# Patient Record
Sex: Female | Born: 2000 | Race: White | Hispanic: No | Marital: Single | Smoking: Never smoker
Health system: Southern US, Community
[De-identification: ages and names within clinical notes are randomized; demographics above are authoritative.]

## PROBLEM LIST (undated history)

## (undated) ENCOUNTER — Inpatient Hospital Stay (HOSPITAL_COMMUNITY): Admission: RE | Payer: Medicaid Other | Source: Ambulatory Visit

## (undated) DIAGNOSIS — F32A Depression, unspecified: Secondary | ICD-10-CM

## (undated) DIAGNOSIS — F419 Anxiety disorder, unspecified: Secondary | ICD-10-CM

## (undated) DIAGNOSIS — R519 Headache, unspecified: Secondary | ICD-10-CM

## (undated) HISTORY — PX: NO PAST SURGERIES: SHX2092

## (undated) HISTORY — PX: CHOLECYSTECTOMY: SHX55

---

## 2018-12-08 DIAGNOSIS — Z8759 Personal history of other complications of pregnancy, childbirth and the puerperium: Secondary | ICD-10-CM

## 2020-03-25 NOTE — L&D Delivery Note (Addendum)
OB/GYN Faculty Practice Delivery Note  Katherine Love is a 20 y.o. G2P0101 s/p SVD at [redacted]w[redacted]d. She was admitted for IOL for new onset gHTN with limited prenatal care.  ROM: 2h 70m with clear fluid GBS Status: Negative Maximum Maternal Temperature: 98.3 F  Delivery Date/Time: 02/07/2021  Delivery: Called to room and patient was complete and pushing. Head delivered, LOA. No nuchal cord present. Shoulder dystocia noted, maneuvers applied (see below). Dr. Ashok Pall paged when suprapubic pressure applied. Maneuvers utilized with delivery of posterior arm, followed by anterior shoulder and body. Infant with spontaneous cry, placed on mother's abdomen, dried and stimulated. Cord clamped without delay, and cut by me. Cord blood drawn. Placenta delivered spontaneously with gentle cord traction. Fundus firm with massage and Pitocin. Labia, perineum, vagina, and cervix inspected inspected with no lacerations.   Delivery of the head: 02/07/2021  1:22 PM First maneuver: 02/07/2021  1:22 PM, McRoberts Second maneuver: 02/07/2021  1:23 PM, Suprapubic Pressure Third maneuver: 02/07/2021 , 1:24 PM, Delivery of posterior arm by Collene Gobble, CNM  Placenta: Intact, 3VC Complications: Shoulder dystocia Lacerations: None EBL: 200 cc Analgesia: Epidural  Infant: Viable  APGARs 7 & 9  3420g  Darral Dash, DO [facility] PGY-1 [facility], [facility] 02/07/2021, 2:47 PM  Katherine Love is a 20 y.o. female [id number] with IUP at [redacted]w[redacted]d admitted for IOL for GHTN with limited prenatal care. Pt had abnormal 1 hour GTT in early pregnancy and no additional testing for GDM.  She progressed with augmentation to complete and pushed less than 20 minutes to deliver.  Shoulder dystocia was identified at delivery of fetal head and 3 maneuvers were required, McRoberts, suprapubic pressure by RN, and delivery of posterior arm by me.  Attempt to page Dr Ashok Pall at suprapubic pressure  was unsuccessful and emergency cord for bed alarm was pulled by RN.  Delivery of posterior arm resulted in rapid delivery of rest of infant who was stimulated on maternal abdomen but taken to warmer for further resuscitation/evaluation by RN after cord clamping.  Infant transitioned well with 7/9 APGARs and remained in room with patient after delivery.   Mom and baby stable prior to transfer to postpartum. She plans on breastfeeding.   Sharen Counter

## 2020-07-01 ENCOUNTER — Inpatient Hospital Stay (HOSPITAL_COMMUNITY)
Admission: AD | Admit: 2020-07-01 | Discharge: 2020-07-02 | Disposition: A | Discharge status: Home / Self Care | Payer: Medicaid Other | Attending: Obstetrics & Gynecology | Admitting: Obstetrics & Gynecology

## 2020-07-01 ENCOUNTER — Other Ambulatory Visit: Payer: Self-pay

## 2020-07-01 ENCOUNTER — Encounter (HOSPITAL_COMMUNITY): Payer: Self-pay

## 2020-07-01 ENCOUNTER — Inpatient Hospital Stay (HOSPITAL_COMMUNITY): Payer: Medicaid Other

## 2020-07-01 DIAGNOSIS — O219 Vomiting of pregnancy, unspecified: Secondary | ICD-10-CM | POA: Insufficient documentation

## 2020-07-01 DIAGNOSIS — Z3A01 Less than 8 weeks gestation of pregnancy: Secondary | ICD-10-CM | POA: Diagnosis not present

## 2020-07-01 DIAGNOSIS — O26891 Other specified pregnancy related conditions, first trimester: Secondary | ICD-10-CM | POA: Insufficient documentation

## 2020-07-01 DIAGNOSIS — R109 Unspecified abdominal pain: Secondary | ICD-10-CM | POA: Insufficient documentation

## 2020-07-01 HISTORY — DX: Anxiety disorder, unspecified: F41.9

## 2020-07-01 HISTORY — DX: Headache, unspecified: R51.9

## 2020-07-01 HISTORY — DX: Depression, unspecified: F32.A

## 2020-07-01 LAB — URINALYSIS, ROUTINE W REFLEX MICROSCOPIC
Bilirubin Urine: NEGATIVE
Glucose, UA: NEGATIVE mg/dL
Hgb urine dipstick: NEGATIVE
Ketones, ur: NEGATIVE mg/dL
Nitrite: NEGATIVE
Protein, ur: NEGATIVE mg/dL
Specific Gravity, Urine: 1.021 (ref 1.005–1.030)
pH: 6 (ref 5.0–8.0)

## 2020-07-01 LAB — CBC
HCT: 41.1 % (ref 36.0–46.0)
Hemoglobin: 14.3 g/dL (ref 12.0–15.0)
MCH: 30.8 pg (ref 26.0–34.0)
MCHC: 34.8 g/dL (ref 30.0–36.0)
MCV: 88.6 fL (ref 80.0–100.0)
Platelets: 386 10*3/uL (ref 150–400)
RBC: 4.64 MIL/uL (ref 3.87–5.11)
RDW: 12.7 % (ref 11.5–15.5)
WBC: 12.5 10*3/uL — ABNORMAL HIGH (ref 4.0–10.5)
nRBC: 0 % (ref 0.0–0.2)

## 2020-07-01 LAB — COMPREHENSIVE METABOLIC PANEL
ALT: 24 U/L (ref 0–44)
AST: 20 U/L (ref 15–41)
Albumin: 4 g/dL (ref 3.5–5.0)
Alkaline Phosphatase: 79 U/L (ref 38–126)
Anion gap: 12 (ref 5–15)
BUN: 9 mg/dL (ref 6–20)
CO2: 22 mmol/L (ref 22–32)
Calcium: 9.7 mg/dL (ref 8.9–10.3)
Chloride: 100 mmol/L (ref 98–111)
Creatinine, Ser: 0.58 mg/dL (ref 0.44–1.00)
GFR, Estimated: >60 mL/min (ref >60)
Glucose, Bld: 95 mg/dL (ref 70–99)
Potassium: 3.7 mmol/L (ref 3.5–5.1)
Sodium: 134 mmol/L — ABNORMAL LOW (ref 135–145)
Total Bilirubin: 0.7 mg/dL (ref 0.3–1.2)
Total Protein: 7.2 g/dL (ref 6.5–8.1)

## 2020-07-01 LAB — I-STAT BETA HCG BLOOD, ED (MC, WL, AP ONLY): I-stat hCG, quantitative: >2000 m[IU]/mL — ABNORMAL HIGH (ref <5)

## 2020-07-01 LAB — LIPASE, BLOOD: Lipase: 32 U/L (ref 11–51)

## 2020-07-01 MED ORDER — PROMETHAZINE HCL 12.5 MG PO TABS: 12.5000 mg | ORAL_TABLET | Freq: Four times a day (QID) | ORAL | 0 refills | Status: DC | PRN

## 2020-07-01 MED ORDER — SODIUM CHLORIDE 0.9 % IV SOLN
25.0000 mg | Freq: Once | INTRAVENOUS | Status: AC
  Administered 2020-07-01: 25 mg via INTRAVENOUS
  Filled 2020-07-01: qty 1

## 2020-07-01 MED ORDER — DOXYLAMINE SUCCINATE (SLEEP) 25 MG PO TABS: 25.0000 mg | ORAL_TABLET | Freq: Three times a day (TID) | ORAL | 0 refills | Status: DC | PRN

## 2020-07-01 MED ORDER — LACTATED RINGERS IV BOLUS
1000.0000 mL | Freq: Once | INTRAVENOUS | Status: AC
  Administered 2020-07-01: 1000 mL via INTRAVENOUS

## 2020-07-01 MED ORDER — PYRIDOXINE HCL 25 MG PO TABS: 25.0000 mg | ORAL_TABLET | Freq: Three times a day (TID) | ORAL | 0 refills | Status: DC

## 2020-07-01 NOTE — MAU Note (Signed)
Liter of IV phenergan finishing-no additional emesis-pt states that her nausea has resolved.

## 2020-07-01 NOTE — ED Triage Notes (Signed)
Patient coming from home with emesis, reports she is unable to keep anything down x several days, reports she has not had an ultrasound to confirm pregnancy but has had multiple positive tests at home, hx of morning sickness.

## 2020-07-01 NOTE — Discharge Instructions (Signed)
Vomiting in First Trimester   What are the causes? The cause of this condition is not known. What increases the risk?  You had vomiting or a feeling like you may vomit before your pregnancy.  You had morning sickness in another pregnancy.  You are pregnant with more than one baby, such as twins. What are the signs or symptoms?  Feeling like you may vomit.  Vomiting. How is this treated? Treatment is usually not needed for this condition. You may only need to change what you eat. In some cases, your doctor may give you some things to take for your condition. These include:  Vitamin B6 supplements.  Medicines to treat the feeling that you may vomit.  Ginger. Follow these instructions at home: Medicines  Take over-the-counter and prescription medicines only as told by your doctor. Do not take any medicines until you talk with your doctor about them first.  Take multivitamins before you get pregnant. These can stop or lessen the symptoms of morning sickness. Eating and drinking  Eat dry toast or crackers before getting out of bed.  Eat 5 or 6 small meals a day.  Eat dry and bland foods like rice and baked potatoes.  Do not eat greasy, fatty, or spicy foods.  Have someone cook for you if the smell of food causes you to vomit or to feel like you may vomit.  If you feel like you may vomit after taking prenatal vitamins, take them at night or with a snack.  Eat protein foods when you need a snack. Nuts, yogurt, and cheese are good choices.  Drink fluids throughout the day.  Try ginger ale made with real ginger, ginger tea made from fresh grated ginger, or ginger candies. General instructions  Do not smoke or use any products that contain nicotine or tobacco. If you need help quitting, ask your doctor.  Use an air purifier to keep the air in your house free of smells.  Get lots of fresh air.  Try to avoid smells that make you feel sick.  Try wearing an acupressure  wristband. This is a wristband that is used to treat seasickness.  Try a treatment called acupuncture. In this treatment, a doctor puts needles into certain areas of your body to make you feel better. Contact a doctor if:  You need medicine to feel better.  You feel dizzy or light-headed.  You are losing weight. Get help right away if:  The feeling that you may vomit will not go away, or you cannot stop vomiting.  You faint.  You have very bad pain in your belly. Summary  Morning sickness is when you feel like you may vomit (feel nauseous) during pregnancy.  You may feel sick in the morning, but you can feel this way at any time of the day.  Making some changes to what you eat may help your symptoms go away. This information is not intended to replace advice given to you by your health care provider. Make sure you discuss any questions you have with your health care provider. Document Revised: 10/25/2019 Document Reviewed: 10/04/2019 Elsevier Patient Education  2021 [employer].

## 2020-07-01 NOTE — ED Provider Notes (Addendum)
Emergency Medicine Provider OB Triage Evaluation Note  Katherine Love is a 20 y.o. female, G2P1, at Unknown gestation who presents to the emergency department with complaints of intractable N/V. 5 Positive home preg tests.  LMP was sometime in January to February, reports irregular periods.  Denies abdominal pain.  Review of  Systems  Positive: nausea, vomiting Negative: abd pain  Physical Exam  BP 128/83 (BP Location: Left Arm)   Pulse 87   Temp 98.3 F (36.8 C) (Oral)   Resp 20   Ht 5\' 5"  (1.651 m)   Wt 99.8 kg   LMP  (LMP Unknown)   SpO2 100%   BMI 36.61 kg/m  General: Alert HEENT: Atraumatic  Resp: Normal effort  Cardiac: Normal rate Abd: Nondistended MSK: Moves all extremities without difficulty Neuro: Speech clear  Medical Decision Making  I-STAT quant HCG is positive. Pt evaluated for pregnancy concern and is stable for transfer to MAU. Pt is in agreement with plan for transfer.  8:17 PM Discussed with MAU provider, Sam, who accepts patient in transfer.  Clinical Impression  No diagnosis found.     Wanetta Funderburke, N, PA-C 07/01/20 2018    Alek Borges, 08/31/20 N, PA-C 07/01/20 2019    08/31/20, MD 07/03/20 1125

## 2020-07-01 NOTE — ED Notes (Signed)
Report called to Adelina Mings, RN MAU.

## 2020-07-01 NOTE — MAU Provider Note (Signed)
History     CSN: [id number]  Arrival date and time: 07/01/20 1934   Event Date/Time   First Provider Initiated Contact with Patient 07/01/20 2140      Chief Complaint  Patient presents with  . Emesis During Pregnancy   HPI Katherine Love is a 20 y.o. G2P0101 at [redacted]w[redacted]d who presents to MAU from [facility] for evaluation of recurrent vomiting in the setting of positive home pregnancy test. She endorses multiple episodes of vomiting each day since mid March. She does not have access to antiemetics. She endorses mild recurrent suprapubic cramping. She denies vaginal bleeding, leaking of fluid, decreased fetal movement, fever, falls, or recent illness.   OB History    Gravida  2   Para  1   Term  0   Preterm  1   AB  0   Living  1     SAB  0   IAB  0   Ectopic  0   Multiple  0   Live Births  1           Past Medical History:  Diagnosis Date  . Anxiety   . Depression   . Headache     Past Surgical History:  Procedure Laterality Date  . NO PAST SURGERIES      Family History  Problem Relation Age of Onset  . Hypertension Mother     Social History   Tobacco Use  . Smoking status: Never Smoker  . Smokeless tobacco: Never Used  Vaping Use  . Vaping Use: Never used  Substance Use Topics  . Alcohol use: Never  . Drug use: Yes    Frequency: 2.0 times per week    Types: Marijuana    Allergies: No Known Allergies  No medications prior to admission.    Review of Systems  Gastrointestinal: Positive for abdominal pain, nausea and vomiting.  All other systems reviewed and are negative.  Physical Exam   Blood pressure 122/70, pulse 77, temperature 98.3 F (36.8 C), temperature source Oral, resp. rate 17, height 5\' 5"  (1.651 m), weight 106.7 kg, SpO2 100 %.  Physical Exam Vitals and nursing note reviewed. Exam conducted with a chaperone present.  Constitutional:      Appearance: Normal appearance. She is obese. She is ill-appearing.  Cardiovascular:      Pulses: Normal pulses.     Heart sounds: Normal heart sounds.  Pulmonary:     Effort: Pulmonary effort is normal.     Breath sounds: Normal breath sounds.  Abdominal:     General: Abdomen is flat. Bowel sounds are normal.  Skin:    Capillary Refill: Capillary refill takes less than 2 seconds.  Neurological:     Mental Status: She is alert and oriented to person, place, and time.  Psychiatric:        Mood and Affect: Mood normal.        Behavior: Behavior normal.        Thought Content: Thought content normal.        Judgment: Judgment normal.     MAU Course  Procedures  Orders Placed This Encounter  Procedures  . OB Comp Less 14 Wks  . Lipase, blood  . Comprehensive metabolic panel  . CBC  . Urinalysis, Routine w reflex microscopic  . Rapid urine drug screen (hospital performed)  . Diet NPO time specified  . I-Stat beta hCG blood, ED  . POC urine preg, ED  . Insert peripheral IV  .  Discharge patient   Patient Vitals for the past 24 hrs:  BP Temp Temp src Pulse Resp SpO2 Height Weight  07/02/20 0005 126/77 98.6 F (37 C) Oral 77 17 100 % -- --  07/01/20 2136 122/70 -- -- 77 17 100 % -- --  07/01/20 2131 -- -- -- -- -- 100 % -- --  07/01/20 2126 -- -- -- -- -- 100 % -- --  07/01/20 2122 132/73 98.3 F (36.8 C) Oral 82 17 100 % 5\' 5"  (1.651 m) 106.7 kg  07/01/20 1942 -- -- -- -- -- -- 5\' 5"  (1.651 m) 99.8 kg  07/01/20 1941 128/83 98.3 F (36.8 C) Oral 87 20 100 % -- --   Results for orders placed or performed during the hospital encounter of 07/01/20 (from the past 24 hour(s))  Urinalysis, Routine w reflex microscopic Urine, Clean Catch     Status: Abnormal   Collection Time: 07/01/20  7:47 PM  Result Value Ref Range   Color, Urine YELLOW YELLOW   APPearance HAZY (A) CLEAR   Specific Gravity, Urine 1.021 1.005 - 1.030   pH 6.0 5.0 - 8.0   Glucose, UA NEGATIVE NEGATIVE mg/dL   Hgb urine dipstick NEGATIVE NEGATIVE   Bilirubin Urine NEGATIVE NEGATIVE    Ketones, ur NEGATIVE NEGATIVE mg/dL   Protein, ur NEGATIVE NEGATIVE mg/dL   Nitrite NEGATIVE NEGATIVE   Leukocytes,Ua MODERATE (A) NEGATIVE   RBC / HPF 0-5 0 - 5 RBC/hpf   WBC, UA 0-5 0 - 5 WBC/hpf   Bacteria, UA RARE (A) NONE SEEN   Squamous Epithelial / LPF 6-10 0 - 5   Mucus PRESENT   Lipase, blood     Status: None   Collection Time: 07/01/20  7:49 PM  Result Value Ref Range   Lipase 32 11 - 51 U/L  Comprehensive metabolic panel     Status: Abnormal   Collection Time: 07/01/20  7:49 PM  Result Value Ref Range   Sodium 134 (L) 135 - 145 mmol/L   Potassium 3.7 3.5 - 5.1 mmol/L   Chloride 100 98 - 111 mmol/L   CO2 22 22 - 32 mmol/L   Glucose, Bld 95 70 - 99 mg/dL   BUN 9 6 - 20 mg/dL   Creatinine, Ser 08/31/20 0.44 - 1.00 mg/dL   Calcium 9.7 8.9 - 08/31/20 mg/dL   Total Protein 7.2 6.5 - 8.1 g/dL   Albumin 4.0 3.5 - 5.0 g/dL   AST 20 15 - 41 U/L   ALT 24 0 - 44 U/L   Alkaline Phosphatase 79 38 - 126 U/L   Total Bilirubin 0.7 0.3 - 1.2 mg/dL   GFR, Estimated 3.84 66.5 mL/min   Anion gap 12 5 - 15  CBC     Status: Abnormal   Collection Time: 07/01/20  7:49 PM  Result Value Ref Range   WBC 12.5 (H) 4.0 - 10.5 K/uL   RBC 4.64 3.87 - 5.11 MIL/uL   Hemoglobin 14.3 12.0 - 15.0 g/dL   HCT >35 08/31/20 - 70.1 %   MCV 88.6 80.0 - 100.0 fL   MCH 30.8 26.0 - 34.0 pg   MCHC 34.8 30.0 - 36.0 g/dL   RDW 77.9 39.0 - 30.0 %   Platelets 386 150 - 400 K/uL   nRBC 0.0 0.0 - 0.2 %  I-Stat beta hCG blood, ED     Status: Abnormal   Collection Time: 07/01/20  8:02 PM  Result Value Ref Range   I-stat  hCG, quantitative >2,000.0 (H) <5 mIU/mL   Comment 3           US OB Comp Less 14 Wks  Result Date: 07/01/2020 CLINICAL DATA:  Abdominal pain EXAM: OBSTETRIC <14 WK ULTRASOUND TECHNIQUE: Transabdominal ultrasound was performed for evaluation of the gestation as well as the maternal uterus and adnexal regions. COMPARISON:  None. FINDINGS: Intrauterine gestational sac: Single intrauterine pregnancy Yolk sac:   Visualized Embryo:  Visualized Cardiac Activity: Visualized Heart Rate: 157 bpm CRL: 12.3 mm   7 w 3 d                  [place] EDC: 02/14/2021 Subchorionic hemorrhage:  None visualized. Maternal uterus/adnexae: Ovaries are within normal limits. The right ovary measures 4.6 x 1.9 x 1.9 cm. Left ovary measures 2.5 x 1.3 x 1.7 cm. Probable corpus luteum in the right ovary. No significant free fluid. IMPRESSION: Single viable intrauterine pregnancy as above. Otherwise no specific abnormality is seen. Electronically Signed   By: Jasmine Pang M.D.   On: 07/01/2020 22:51   Meds ordered this encounter  Medications  . lactated ringers bolus 1,000 mL  . promethazine (PHENERGAN) 25 mg in sodium chloride 0.9 % 1,000 mL infusion  . pyridOXINE (VITAMIN B-6) 25 MG tablet    Sig: Take 1 tablet (25 mg total) by mouth every 8 (eight) hours.    Dispense:  30 tablet    Refill:  0    Order Specific Question:   Supervising Provider    Answer:   Despina Hidden, LUTHER H [[id number]]  . doxylamine, Sleep, (UNISOM) 25 MG tablet    Sig: Take 1 tablet (25 mg total) by mouth every 8 (eight) hours as needed.    Dispense:  30 tablet    Refill:  0    Order Specific Question:   Supervising Provider    Answer:   Despina Hidden, LUTHER H [[id number]]  . promethazine (PHENERGAN) 12.5 MG tablet    Sig: Take 1 tablet (12.5 mg total) by mouth every 6 (six) hours as needed for nausea or vomiting. For vomiting not controlled with B6 and Unisom    Dispense:  30 tablet    Refill:  0    Order Specific Question:   Supervising Provider    Answer:   Lazaro Arms [[id number]]   Assessment and Plan  --20 y.o. G2P0101 with live IUP at [redacted]w[redacted]d  --No ketonuria --New outpatient regimen for management of vomiting --Discharge home in stable condition  Calvert Cantor, CNM 07/02/2020, 1:57 AM

## 2020-07-01 NOTE — MAU Note (Signed)
Pt transferred from ED with complaints of nausea and vomiting. Pt reports nausea for several weeks -and reports she performed a home pregnancy test and had a positive result on March 10. Pt reports irregular periods and gives an LMP in January or February. Pt states vomiting began on Monday. She has tried several different foods and is only able to tolerate sips of water without vomiting. Reports emesis is yellow or consumed food. Denies diarrhea, burning or pain in throat or abdomen. Also denies vaginal bleeding or discharge.

## 2020-07-02 DIAGNOSIS — Z3A01 Less than 8 weeks gestation of pregnancy: Secondary | ICD-10-CM | POA: Diagnosis not present

## 2020-07-02 DIAGNOSIS — O219 Vomiting of pregnancy, unspecified: Secondary | ICD-10-CM

## 2020-07-04 NOTE — Progress Notes (Signed)
    SUBJECTIVE:   CHIEF COMPLAINT / HPI:   Ms. Katherine Love is a 20 yo who presents to establish care.   [redacted]w[redacted]d pregnant. Presented to MAU on 4/10 with recurrent vomiting in the setting of a positive home pregnancy test. US performed in MAU confirming pregnancy. HCG >2,000. She was prescribed Unisom, phenergan, pyridoxine.  Today she states she feels better since starting the medication regimen above from the ED. She endorses right knee pain- hurts to squat, going up stairs. Denies injury to knee. Hurt after son was born, and then again about 5 months ago. Painful after extensive walking or standing on it. Has tried ice without much relief. Has tried a brace for support without relief.   PERTINENT  PMH / PSH:  Anxiety, depression   OBJECTIVE:   BP 122/78   Pulse (!) 105   Wt 234 lb (106.1 kg)   LMP  (LMP Unknown)   SpO2 98%   BMI 38.94 kg/m    Physical exam  General: well appearing, NAD Cardiovascular: RRR, no murmurs Lungs: CTAB. Normal WOB Abdomen: soft, non-distended, non-tender Skin: warm, dry. No edema MSK: normal strength and ROM of extremities bilaterally. R knee without erythema or edema. Non tender to palpation. Normal anterior and posterior drawer tests, normal valgus varus force.   ASSESSMENT/PLAN:   No problem-specific Assessment & Plan notes found for this encounter.  R knee pain   R knee pain when squatting, climbing up or down stairs, or after a lot of walking. 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 Without tenderness to palpation or inciting injury unlikely to be fracture. Gave patient information on condition and that likely as her pregnancy progresses it will be harder on her knees.  - quad exercises - alternate between ice and heat  - Tylenol 500mg  q 4-6h as needed for pain   Pregnancy  G2P0101. [redacted]w[redacted]d. Confirmed via [redacted]w[redacted]d in MAU.   F/u in 2-3 weeks for initial OB visit and to take care of more health maintenance  items     [place], DO [facility]

## 2020-07-05 ENCOUNTER — Ambulatory Visit: Payer: Medicaid Other | Admitting: Family Medicine

## 2020-07-05 ENCOUNTER — Encounter: Payer: Self-pay | Admitting: Family Medicine

## 2020-07-05 ENCOUNTER — Other Ambulatory Visit: Payer: Self-pay

## 2020-07-05 VITALS — BP 122/78 | HR 105 | Wt 234.0 lb

## 2020-07-05 DIAGNOSIS — G8929 Other chronic pain: Secondary | ICD-10-CM | POA: Diagnosis not present

## 2020-07-05 DIAGNOSIS — M25561 Pain in right knee: Secondary | ICD-10-CM

## 2020-07-05 NOTE — Patient Instructions (Signed)
It was great seeing you today!  Today was your first visit to establish care at the clinic. We discussed your knee pain which is likely due to patellofemoral syndrome (information below). I recommend doing some quad exercises, and alternating between ice and heat for relief. You can also take Tylenol 500mg  every 4-6 hours as needed for pain   Please check-out at the front desk before leaving the clinic. I'd like to see you back in the next 2-3 weeks for your initial OB visit, but if you need to be seen earlier than that for any new issues we're happy to fit you in, just give a call!  Feel free to call with any questions or concerns at any time, at [phone].   Take care,  Dr. [phone] [facility]   Patellofemoral Pain Syndrome  Patellofemoral pain syndrome is a condition in which the tissue (cartilage) on the underside of the kneecap (patella) softens or breaks down. This causes pain in the front of the knee. The condition is also called runner's knee or chondromalacia patella. Patellofemoral pain syndrome is most common in young adults who are active in sports. The knee is the largest joint in the body. The patella covers the front of the knee and is attached to muscles above and below the knee. The underside of the patella is covered with a smooth type of cartilage (synovium). The smooth surface helps the patella glide easily when you move your knee. Patellofemoral pain syndrome causes swelling in the joint linings and bone surfaces in the knee. What are the causes? This condition may be caused by:  Overuse of the knee.  Poor alignment of your knee joints.  Weak leg muscles.  A direct hit to your kneecap. What increases the risk? You are more likely to develop this condition if:  You do a lot of activities that can wear down your kneecap. These include: ? Running. ? Squatting. ? Climbing stairs.  You start a new physical activity or exercise  program.  You wear shoes that do not fit well.  You do not have good leg strength.  You are overweight. What are the signs or symptoms? The main symptom of this condition is knee pain. This may feel like a dull, aching pain underneath your patella, in the front of your knee. There may be a popping or cracking sound when you move your knee. Pain may get worse with:  Exercise.  Climbing stairs.  Running.  Jumping.  Squatting.  Kneeling.  Sitting for a long time.  Moving or pushing on your patella. How is this diagnosed? This condition may be diagnosed based on:  Your symptoms and medical history. You may be asked about your recent physical activities and which ones cause knee pain.  A physical exam. This may include: ? Moving your patella back and forth. ? Checking your range of knee motion. ? Having you squat or jump to see if you have pain. ? Checking the strength of your leg muscles.  Imaging tests to confirm the diagnosis. These may include an MRI of your knee. How is this treated? This condition may be treated at home with rest, ice, compression, and elevation (RICE).  Other treatments may include:  NSAIDs, such as ibuprofen.  Physical therapy to stretch and strengthen your leg muscles.  Shoe inserts (orthotics) to take stress off your knee.  A knee brace or knee support.  Adhesive tapes to the skin.  Surgery to remove  damaged cartilage or move the patella to a better position. This is rare. Follow these instructions at home: If you have a brace:  Wear the brace as told by your health care provider. Remove it only as told by your health care provider.  Loosen the brace if your toes tingle, become numb, or turn cold and blue.  Keep the brace clean.  If the brace is not waterproof: ? Do not let it get wet. ? Cover it with a watertight covering when you take a bath or a shower. Managing pain, stiffness, and swelling  If directed, put ice on the  painful area. To do this: ? If you have a removable brace, remove it as told by your health care provider. ? Put ice in a plastic bag. ? Place a towel between your skin and the bag. ? Leave the ice on for 20 minutes, 2-3 times a day. ? Remove the ice if your skin turns bright red. This is very important. If you cannot feel pain, heat, or cold, you have a greater risk of damage to the area.  Move your toes often to reduce stiffness and swelling.  Raise (elevate) the injured area above the level of your heart while you are sitting or lying down.   Activity  Rest your knee.  Avoid activities that cause knee pain.  Perform stretching and strengthening exercises as told by your health care provider or physical therapist.  Return to your normal activities as told by your health care provider. Ask your health care provider what activities are safe for you. General instructions  Take over-the-counter and prescription medicines only as told by your health care provider.  Use splints, braces, knee supports, or walking aids as directed by your health care provider.  Do not use any products that contain nicotine or tobacco, such as cigarettes, e-cigarettes, and chewing tobacco. These can delay healing. If you need help quitting, ask your health care provider.  Keep all follow-up visits. This is important. Contact a health care provider if:  Your symptoms get worse.  You are not improving with home care. Summary  Patellofemoral pain syndrome is a condition in which the tissue (cartilage) on the underside of the kneecap (patella) softens or breaks down.  This condition causes swelling in the joint linings and bone surfaces in the knee. This leads to pain in the front of the knee.  This condition may be treated at home with rest, ice, compression, and elevation (RICE).  Use splints, braces, knee supports, or walking aids as directed by your health care provider. This information is not  intended to replace advice given to you by your health care provider. Make sure you discuss any questions you have with your health care provider. Document Revised: 08/25/2019 Document Reviewed: 08/25/2019 Elsevier Patient Education  2021 [employer].

## 2020-07-28 ENCOUNTER — Ambulatory Visit: Payer: Medicaid Other | Admitting: Family Medicine

## 2020-08-02 ENCOUNTER — Encounter: Payer: Medicaid Other | Admitting: Family Medicine

## 2020-08-02 ENCOUNTER — Telehealth: Payer: Self-pay | Admitting: Family Medicine

## 2020-08-02 NOTE — Telephone Encounter (Signed)
Will forward to [employer] [facility] [employer] who handles these New OB appts.  Jared Whorley,CMA

## 2020-08-02 NOTE — Patient Instructions (Incomplete)
Please go to the MAU (maternal assessment unit) at [facility] if you have any extreme cramping, loss of fluid, vaginal bleeding.      Common Medications Safe in Pregnancy  Acne:      Constipation:  Benzoyl Peroxide     Colace  Clindamycin      Dulcolax Suppository  Topica Erythromycin     Fibercon  Salicylic Acid      Metamucil         Miralax AVOID:        Senakot   Accutane    Cough:  Retin-A       Cough Drops  Tetracycline      Phenergan w/ Codeine if Rx  Minocycline      Robitussin (Plain & DM)  Antibiotics:     Crabs/Lice:  Ceclor       RID  Cephalosporins    AVOID:  E-Mycins      Kwell  Keflex  Macrobid/Macrodantin   Diarrhea:  Penicillin      Kao-Pectate  Zithromax      Imodium AD         PUSH FLUIDS AVOID:       Cipro     Fever:  Tetracycline      Tylenol (Regular or Extra  Minocycline       Strength)  Levaquin      Extra Strength-Do not          Exceed 8 tabs/24 hrs Caffeine:        <273m/day (equiv. To 1 cup of coffee or  approx. 3 12 oz sodas)         Gas: Cold/Hayfever:       Gas-X  Benadryl      Mylicon  Claritin       Phazyme  **Claritin-D        Chlor-Trimeton    Headaches:  Dimetapp      ASA-Free Excedrin  Drixoral-Non-Drowsy     Cold Compress  Mucinex (Guaifenasin)     Tylenol (Regular or Extra  Sudafed/Sudafed-12 Hour     Strength)  **Sudafed PE Pseudoephedrine   Tylenol Cold & Sinus     Vicks Vapor Rub  Zyrtec  **AVOID if Problems With Blood Pressure         Heartburn: Avoid lying down for at least 1 hour after meals  Aciphex      Maalox     Rash:  Milk of Magnesia     Benadryl    Mylanta       1% Hydrocortisone Cream  Pepcid  Pepcid Complete   Sleep Aids:  Prevacid      Ambien   Prilosec       Benadryl  Rolaids       Chamomile Tea  Tums (Limit 4/day)     Unisom         Tylenol PM         Warm milk-add vanilla or  Hemorrhoids:       Sugar for taste  Anusol/Anusol H.C.  (RX: Analapram 2.5%)  Sugar  Substitutes:  Hydrocortisone OTC     Ok in moderation  Preparation H      Tucks        Vaseline lotion applied to tissue with wiping    Herpes:     Throat:  Acyclovir      Oragel  Famvir  Valtrex     Vaccines:         Flu Shot Leg  Cramps:       *Gardasil  Benadryl      Hepatitis A         Hepatitis B Nasal Spray:       Pneumovax  Saline Nasal Spray     Polio Booster         Tetanus Nausea:       Tuberculosis test or PPD  Vitamin B6 25 mg TID   AVOID:    Dramamine      *Gardasil  Emetrol       Live Poliovirus  Ginger Root 250 mg QID    MMR (measles, mumps &  High Complex Carbs @ Bedtime    rebella)  Sea Bands-Accupressure    Varicella (Chickenpox)  Unisom 1/2 tab TID     *No known complications           If received before Pain:         Known pregnancy;   Darvocet       Resume series after  Lortab        Delivery  Percocet    Yeast:   Tramadol      Femstat  Tylenol 3      Gyne-lotrimin  Ultram       Monistat  Vicodin           MISC:         All Sunscreens           Hair Coloring/highlights          Insect Repellant's          (Including DEET)         Mystic Tans       Commonly Asked Questions During Pregnancy  Cats: A parasite can be excreted in cat feces.  To avoid exposure you need to have another person empty the little box.  If you must empty the litter box you will need to wear gloves.  Wash your hands after handling your cat.  This parasite can also be found in raw or undercooked meat so this should also be avoided.  Colds, Sore Throats, Flu: Please check your medication sheet to see what you can take for symptoms.  If your symptoms are unrelieved by these medications please call the office.  Dental Work: Most any dental work Investment banker, corporate recommends is permitted.  X-rays should only be taken during the first trimester if absolutely necessary.  Your abdomen should be shielded with a lead apron during all x-rays.  Please notify your provider prior to receiving  any x-rays.  Novocaine is fine; gas is not recommended.  If your dentist requires a note from [place] prior to dental work please call the office and we will provide one for you.  Exercise: Exercise is an important part of staying healthy during your pregnancy.  You may continue most exercises you were accustomed to prior to pregnancy.  Later in your pregnancy you will most likely notice you have difficulty with activities requiring balance like riding a bicycle.  It is important that you listen to your body and avoid activities that put you at a higher risk of falling.  Adequate rest and staying well hydrated are a must!  If you have questions about the safety of specific activities ask your provider.    Exposure to Children with illness: Try to avoid obvious exposure; report any symptoms to [place] when noted,  If you have chicken pos, red measles or mumps, you should be immune to these  diseases.   Please do not take any vaccines while pregnant unless you have checked with your OB provider.  Fetal Movement: After 28 weeks we recommend you do "kick counts" twice daily.  Lie or sit down in a calm quiet environment and count your baby movements "kicks".  You should feel your baby at least 10 times per hour.  If you have not felt 10 kicks within the first hour get up, walk around and have something sweet to eat or drink then repeat for an additional hour.  If count remains less than 10 per hour notify your provider.  Fumigating: Follow your pest control agent's advice as to how long to stay out of your home.  Ventilate the area well before re-entering.  Hemorrhoids:   Most over-the-counter preparations can be used during pregnancy.  Check your medication to see what is safe to use.  It is important to use a stool softener or fiber in your diet and to drink lots of liquids.  If hemorrhoids seem to be getting worse please call the office.   Hot Tubs:  Hot tubs Jacuzzis and saunas are not recommended while pregnant.   These increase your internal body temperature and should be avoided.  Intercourse:  Sexual intercourse is safe during pregnancy as long as you are comfortable, unless otherwise advised by your provider.  Spotting may occur after intercourse; report any bright red bleeding that is heavier than spotting.  Labor:  If you know that you are in labor, please go to the hospital.  If you are unsure, please call the office and let us help you decide what to do.  Lifting, straining, etc:  If your job requires heavy lifting or straining please check with your provider for any limitations.  Generally, you should not lift items heavier than that you can lift simply with your hands and arms (no back muscles)  Painting:  Paint fumes do not harm your pregnancy, but may make you ill and should be avoided if possible.  Latex or water based paints have less odor than oils.  Use adequate ventilation while painting.  Permanents & Hair Color:  Chemicals in hair dyes are not recommended as they cause increase hair dryness which can increase hair loss during pregnancy.  " Highlighting" and permanents are allowed.  Dye may be absorbed differently and permanents may not hold as well during pregnancy.  Sunbathing:  Use a sunscreen, as skin burns easily during pregnancy.  Drink plenty of fluids; avoid over heating.  Tanning Beds:  Because their possible side effects are still unknown, tanning beds are not recommended.  Ultrasound Scans:  Routine ultrasounds are performed at approximately 20 weeks.  You will be able to see your baby's general anatomy an if you would like to know the gender this can usually be determined as well.  If it is questionable when you conceived you may also receive an ultrasound early in your pregnancy for dating purposes.  Otherwise ultrasound exams are not routinely performed unless there is a medical necessity.  Although you can request a scan we ask that you pay for it when conducted because  insurance does not cover " patient request" scans.  Work: If your pregnancy proceeds without complications you may work until your due date, unless your physician or employer advises otherwise.  Round Ligament Pain/Pelvic Discomfort:  Sharp, shooting pains not associated with bleeding are fairly common, usually occurring in the second trimester of pregnancy.  They tend to be worse when  standing up or when you remain standing for long periods of time.  These are the result of pressure of certain pelvic ligaments called "round ligaments".  Rest, Tylenol and heat seem to be the most effective relief.  As the womb and fetus grow, they rise out of the pelvis and the discomfort improves.  Please notify the office if your pain seems different than that described.  It may represent a more serious condition.      [email or web address].[email or web address].[email or web address]">  First Trimester of Pregnancy  The first trimester of pregnancy starts on the first day of your last menstrual period until the end of week 12. This is also called months 1 through 3 of pregnancy. Body changes during your first trimester Your body goes through many changes during pregnancy. The changes usually return to normal after your baby is born. Physical changes  You may gain or lose weight.  Your breasts may grow larger and hurt. The area around your nipples may get darker.  Dark spots or blotches may develop on your face.  You may have changes in your hair. Health changes  You may feel like you might vomit (nauseous), and you may vomit.  You may have heartburn.  You may have headaches.  You may have trouble pooping (constipation).  Your gums may bleed. Other changes  You may get tired easily.  You may pee (urinate) more often.  Your menstrual periods will stop.  You may not feel hungry.  You may want to eat certain kinds of food.  You may have changes in your emotions from day to day.  You may have more  dreams. Follow these instructions at home: Medicines  Take over-the-counter and prescription medicines only as told by your doctor. Some medicines are not safe during pregnancy.  Take a prenatal vitamin that contains at least 600 micrograms (mcg) of folic acid. Eating and drinking  Eat healthy meals that include: ? Fresh fruits and vegetables. ? Whole grains. ? Good sources of protein, such as meat, eggs, or tofu. ? Low-fat dairy products.  Avoid raw meat and unpasteurized juice, milk, and cheese.  If you feel like you may vomit, or you vomit: ? Eat 4 or 5 small meals a day instead of 3 large meals. ? Try eating a few soda crackers. ? Drink liquids between meals instead of during meals.  You may need to take these actions to prevent or treat trouble pooping: ? Drink enough fluids to keep your pee (urine) pale yellow. ? Eat foods that are high in fiber. These include beans, whole grains, and fresh fruits and vegetables. ? Limit foods that are high in fat and sugar. These include fried or sweet foods. Activity  Exercise only as told by your doctor. Most people can do their usual exercise routine during pregnancy.  Stop exercising if you have cramps or pain in your lower belly (abdomen) or low back.  Do not exercise if it is too hot or too humid, or if you are in a place of great height (high altitude).  Avoid heavy lifting.  If you choose to, you may have sex unless your doctor tells you not to. Relieving pain and discomfort  Wear a good support bra if your breasts are sore.  Rest with your legs raised (elevated) if you have leg cramps or low back pain.  If you have bulging veins (varicose veins) in your legs: ? Wear support hose as told by your doctor. ? Raise your feet for 15 minutes,  3-4 times a day. ? Limit salt in your food. Safety  Wear your seat belt at all times when you are in a car.  Talk with your doctor if someone is hurting you or yelling at you.  Talk  with your doctor if you are feeling sad or have thoughts of hurting yourself. Lifestyle  Do not use hot tubs, steam rooms, or saunas.  Do not douche. Do not use tampons or scented sanitary pads.  Do not use herbal medicines, illegal drugs, or medicines that are not approved by your doctor. Do not drink alcohol.  Do not smoke or use any products that contain nicotine or tobacco. If you need help quitting, ask your doctor.  Avoid cat litter boxes and soil that is used by cats. These carry germs that can cause harm to the baby and can cause a loss of your baby by miscarriage or stillbirth. General instructions  Keep all follow-up visits. This is important.  Ask for help if you need counseling or if you need help with nutrition. Your doctor can give you advice or tell you where to go for help.  Visit your dentist. At home, brush your teeth with a soft toothbrush. Floss gently.  Write down your questions. Take them to your prenatal visits. Where to find more information  American Pregnancy Association: americanpregnancy.org  [employer]stetricians and Gynecologists: www.acog.org  Office on Women's Health: KeywordPortfolios.com.br Contact a doctor if:  You are dizzy.  You have a fever.  You have mild cramps or pressure in your lower belly.  You have a nagging pain in your belly area.  You continue to feel like you may vomit, you vomit, or you have watery poop (diarrhea) for 24 hours or longer.  You have a bad-smelling fluid coming from your vagina.  You have pain when you pee.  You are exposed to a disease that spreads from person to person, such as chickenpox, measles, Zika virus, HIV, or hepatitis. Get help right away if:  You have spotting or bleeding from your vagina.  You have very bad belly cramping or pain.  You have shortness of breath or chest pain.  You have any kind of injury, such as from a fall or a car crash.  You have new or increased pain,  swelling, or redness in an arm or leg. Summary  The first trimester of pregnancy starts on the first day of your last menstrual period until the end of week 12 (months 1 through 3).  Eat 4 or 5 small meals a day instead of 3 large meals.  Do not smoke or use any products that contain nicotine or tobacco. If you need help quitting, ask your doctor.  Keep all follow-up visits. This information is not intended to replace advice given to you by your health care provider. Make sure you discuss any questions you have with your health care provider. Document Revised: 08/18/2019 Document Reviewed: 06/24/2019 Elsevier Patient Education  2021 [employer].

## 2020-08-02 NOTE — Telephone Encounter (Signed)
Attempted to call patient x2 as she did not show for her initial Ob prenatal visit. Patient will need to be rescheduled if she would like to continue care with our clinic.  Aryonna Gunnerson, DO

## 2020-08-25 ENCOUNTER — Ambulatory Visit: Payer: Medicaid Other | Admitting: Family Medicine

## 2020-08-25 ENCOUNTER — Other Ambulatory Visit: Payer: Self-pay

## 2020-08-25 VITALS — BP 110/62 | HR 105 | Wt 235.2 lb

## 2020-08-25 DIAGNOSIS — O99321 Drug use complicating pregnancy, first trimester: Secondary | ICD-10-CM

## 2020-08-25 DIAGNOSIS — Z3481 Encounter for supervision of other normal pregnancy, first trimester: Secondary | ICD-10-CM

## 2020-08-25 DIAGNOSIS — Z3A15 15 weeks gestation of pregnancy: Secondary | ICD-10-CM | POA: Diagnosis not present

## 2020-08-25 DIAGNOSIS — Z3490 Encounter for supervision of normal pregnancy, unspecified, unspecified trimester: Secondary | ICD-10-CM | POA: Insufficient documentation

## 2020-08-25 MED ORDER — PROMETHAZINE HCL 12.5 MG PO TABS: 12.5000 mg | ORAL_TABLET | Freq: Four times a day (QID) | ORAL | 0 refills | Status: DC | PRN

## 2020-08-25 MED ORDER — PYRIDOXINE HCL 25 MG PO TABS
25.0000 mg | ORAL_TABLET | Freq: Three times a day (TID) | ORAL | 0 refills | Status: DC
Start: 2020-08-25 — End: 2020-11-04

## 2020-08-25 NOTE — Patient Instructions (Addendum)
It was great seeing you today!  Today we did your initial OB visit and checked your blood work. I have also refilled your prescriptions.  Please check-out at the front desk before leaving the clinic. I'd like to see you back in 3-4 weeks for your next OB visit, but if you need to be seen earlier than that for any new issues we're happy to fit you in, just give [place] a call!  Visit Reminders:  - Stop by the pharmacy to pick up your prescriptions  - Continue to work on your healthy eating habits and incorporating exercise into your daily life.  Please bring all of your medications with you to each visit.    If you haven't already, sign up for My Chart to have easy access to your labs results, and communication with your primary care physician.  Feel free to call with any questions or concerns at any time, at [phone].   Take care,  Dr. Cora Collum [facility]   [email or web address]">  Prenatal Care Prenatal care is health care during pregnancy. It helps you and your unborn baby (fetus) stay as healthy as possible. Prenatal care may be provided by a midwife, a family practice doctor, a Dispensing optician (nurse practitioner or physician assistant), or a childbirth and pregnancy doctor (obstetrician). How does this affect me? During pregnancy, you will be closely monitored for any new conditions that might develop. To lower your risk of pregnancy complications, you and your health care provider will talk about any underlying conditions you have. How does this affect my baby? Early and consistent prenatal care increases the chance that your baby will be healthy during pregnancy. Prenatal care lowers the risk that your baby will be:  Born early (prematurely).  Smaller than expected at birth (small for gestational age). What can I expect at the first prenatal care visit? Your first prenatal care visit will  likely be the longest. You should schedule your first prenatal care visit as soon as you know that you are pregnant. Your first visit is a good time to talk about any questions or concerns you have about pregnancy. Medical history At your visit, you and your health care provider will talk about your medical history, including:  Any past pregnancies.  Your family's medical history.  Medical history of the baby's father.  Any long-term (chronic) health conditions you have and how you manage them.  Any surgeries or procedures you have had.  Any current over-the-counter or prescription medicines, herbs, or supplements that you are taking.  Other factors that could pose a risk to your baby, including: ? Exposure to harmful chemicals or radiation at work or at home. ? Any substance use, including tobacco, alcohol, and drug use.  Your home setting and your stress levels, including: ? Exposure to abuse or violence. ? Household financial strain.  Your daily health habits, including diet and exercise. Tests and screenings Your health care provider will:  Measure your weight, height, and blood pressure.  Do a physical exam, including a pelvic and breast exam.  Perform blood tests and urine tests to check for: ? Urinary tract infection. ? Sexually transmitted infections (STIs). ? Low iron levels in your blood (anemia). ? Blood type and certain proteins on red blood cells (Rh antibodies). ? Infections and immunity to viruses, such as hepatitis B and rubella. ? HIV (human immunodeficiency virus).  Discuss your options for genetic screening. Tips about staying healthy Your health care provider will also  give you information about how to keep yourself and your baby healthy, including:  Nutrition and taking vitamins.  Physical activity.  How to manage pregnancy symptoms such as nausea and vomiting (morning sickness).  Infections and substances that may be harmful to your baby and how  to avoid them.  Food safety.  Dental care.  Working.  Travel.  Warning signs to watch for and when to call your health care provider. How often will I have prenatal care visits? After your first prenatal care visit, you will have regular visits throughout your pregnancy. The visit schedule is often as follows:  Up to week 28 of pregnancy: once every 4 weeks.  28-36 weeks: once every 2 weeks.  After 36 weeks: every week until delivery. Some women may have visits more or less often depending on any underlying health conditions and the health of the baby. Keep all follow-up and prenatal care visits. This is important. What happens during routine prenatal care visits? Your health care provider will:  Measure your weight and blood pressure.  Check for fetal heart sounds.  Measure the height of your uterus in your abdomen (fundal height). This may be measured starting around week 20 of pregnancy.  Check the position of your baby inside your uterus.  Ask questions about your diet, sleeping patterns, and whether you can feel the baby move.  Review warning signs to watch for and signs of labor.  Ask about any pregnancy symptoms you are having and how you are dealing with them. Symptoms may include: ? Headaches. ? Nausea and vomiting. ? Vaginal discharge. ? Swelling. ? Fatigue. ? Constipation. ? Changes in your vision. ? Feeling persistently sad or anxious. ? Any discomfort, including back or pelvic pain. ? Bleeding or spotting. Make a list of questions to ask your health care provider at your routine visits.   What tests might I have during prenatal care visits? You may have blood, urine, and imaging tests throughout your pregnancy, such as:  Urine tests to check for glucose, protein, or signs of infection.  Glucose tests to check for a form of diabetes that can develop during pregnancy (gestational diabetes mellitus). This is usually done around week 24 of  pregnancy.  Ultrasounds to check your baby's growth and development, to check for birth defects, and to check your baby's well-being. These can also help to decide when you should deliver your baby.  A test to check for group B strep (GBS) infection. This is usually done around week 36 of pregnancy.  Genetic testing. This may include blood, fluid, or tissue sampling, or imaging tests, such as an ultrasound. Some genetic tests are done during the first trimester and some are done during the second trimester. What else can I expect during prenatal care visits? Your health care provider may recommend getting certain vaccines during pregnancy. These may include:  A yearly flu shot (annual influenza vaccine). This is especially important if you will be pregnant during flu season.  Tdap (tetanus, diphtheria, pertussis) vaccine. Getting this vaccine during pregnancy can protect your baby from whooping cough (pertussis) after birth. This vaccine may be recommended between weeks 27 and 36 of pregnancy.  A COVID-19 vaccine. Later in your pregnancy, your health care provider may give you information about:  Childbirth and breastfeeding classes.  Choosing a health care provider for your baby.  Umbilical cord banking.  Breastfeeding.  Birth control after your baby is born.  The hospital labor and delivery unit and how to set  up a tour.  Registering at the hospital before you go into labor. Where to find more information  Office on Women's Health: TravelLesson.ca  American Pregnancy Association: americanpregnancy.org  March of Dimes: marchofdimes.org Summary  Prenatal care helps you and your baby stay as healthy as possible during pregnancy.  Your first prenatal care visit will most likely be the longest.  You will have visits and tests throughout your pregnancy to monitor your health and your baby's health.  Bring a list of questions to your visits to ask your health care  provider.  Make sure to keep all follow-up and prenatal care visits. This information is not intended to replace advice given to you by your health care provider. Make sure you discuss any questions you have with your health care provider. Document Revised: 12/23/2019 Document Reviewed: 12/23/2019 Elsevier Patient Education  2021 [employer].

## 2020-08-25 NOTE — Progress Notes (Addendum)
Patient Name: Katherine Love Date of Birth: [birth year] [facility] Initial Prenatal Visit  Katherine Love is a 20 y.o. year old G2P0101 at [redacted]w[redacted]d who presents for her initial prenatal visit. Pregnancy is planned She reports breast tenderness, fatigue, frequent urination, morning sickness and nausea. Improvement in nausea and morning sickness since taking her current medication  She is taking a prenatal vitamin.  She denies pelvic pain or vaginal bleeding.   Pregnancy Dating: The patient is dated by early [place] on 07/01/20 LMP: Believes around January but has been very irregular  Period is certain:  No.  Periods were regular:  No.  LMP was a typical period:  No.  Using hormonal contraception in 3 months prior to conception: No  Lab Review:  Blood type: O Rh Status: - Antibody screen: Negative HIV: non reactive  RPR: Negative Hemoglobin electrophoresis reviewed: No Results of OB urine culture are: Negative Rubella: Unknown Hep C Ab: Negative Varicella status is Immune  PMH: Reviewed and as detailed below: HTN: No  Gestational Hypertension/preeclampsia: No  Type 1 or 2 Diabetes: No  Depression:  Yes a little here and there in general  Seizure disorder:  No VTE: No ,  History of STI Yes, 1st tremester of prior pregnancy  Abnormal Pap smear:  No, Genital herpes simplex:  No   PSH: Gynecologic Surgery:  no Surgical history reviewed, notable for: N/A  Obstetric History: Obstetric history tab updated and reviewed.  Summary of prior pregnancies:  Cesarean delivery: No  Gestational Diabetes:  No Hypertension in pregnancy: No History of preterm birth: No History of LGA/SGA infant:  No History of shoulder dystocia: No Indications for referral were reviewed, and the patient has no obstetric indications for referral to High Risk OB Clinic at this time.   Social History: Partner's name: Dondra Prader  Tobacco use: No Alcohol use:  No Other substance use:   Yes Marijuana about once a week   Current Medications:  Promethazine 12.5, Vit B6, Doxylamine succinate 25 for sleep  Reviewed and appropriate in pregnancy.   Genetic and Infection Screen: Flow Sheet Updated Yes  Prenatal Exam: Gen: Well nourished, well developed.  No distress.   HEENT: Normocephalic, atraumatic.  Neck supple without cervical lymphadenopathy CV: RRR no murmur, gallops or rubs Lungs: CTA B.  Normal respiratory effort without wheezes or rales. Abd: soft, NTND. +BS.  Uterus not appreciated above pelvis. Ext: No clubbing, cyanosis or edema. Psych: Not depressed or anxious appearing.  Normal thought content   Fetal heart tones: Appropriate HR 148 bpm  Assessment/Plan:  Katherine Love is a 20 y.o. G2P0101 at [redacted]w[redacted]d who presents to initiate prenatal care. She is doing well.  Current pregnancy issues include: nausea  Routine prenatal care: As dating is reliable, a dating ultrasound has been ordered. Dating tab updated. Pre-pregnancy weight updated. Expected weight gain this pregnancy is 11-20 pounds  Prenatal labs reviewed Indications for referral to HROB were reviewed and the patient does not meet criteria for referral.  Medication list reviewed and updated.  Recommended patient see a dentist for regular care.  Bleeding and pain precautions reviewed. Importance of prenatal vitamins reviewed.  Genetic screening offered. Patient opted for: quad screen at 16-20 weeks. The patient does not have an indication for aspirin therapy beginning at 12-16 weeks. Aspirin was not  recommended today.  The patient will not be age 11 or over at time of delivery. Referral to genetic counseling was not offered today.  The patient has the  following risk factors for preexisting diabetes: Reviewed indications for early 1 hour glucose testing, not indicated . An early 1 hour glucose tolerance test was not ordered. Pregnancy Medical Home and PHQ-9 forms completed, problems noted: No  2.  Pregnancy issues include the following which were addressed today:  Morning sickness: refilled medications that were prescribed in the MAU that have been working well for her: Phenergan, Vit B6 Discussed marijuana cessation and importance for health of the patient and baby. Also discussed potential SW consult after delivery if marijuana detected    Follow up 4 weeks for next prenatal visit.

## 2020-08-28 LAB — OBSTETRIC PANEL, INCLUDING HIV
Antibody Screen: NEGATIVE
Basophils Absolute: 0 10*3/uL (ref 0.0–0.2)
Basos: 0 %
EOS (ABSOLUTE): 0.1 10*3/uL (ref 0.0–0.4)
Eos: 1 %
HIV Screen 4th Generation wRfx: NONREACTIVE
Hematocrit: 35.6 % (ref 34.0–46.6)
Hemoglobin: 12.3 g/dL (ref 11.1–15.9)
Hepatitis B Surface Ag: NEGATIVE
Immature Grans (Abs): 0 10*3/uL (ref 0.0–0.1)
Immature Granulocytes: 0 %
Lymphocytes Absolute: 2.4 10*3/uL (ref 0.7–3.1)
Lymphs: 24 %
MCH: 30.6 pg (ref 26.6–33.0)
MCHC: 34.6 g/dL (ref 31.5–35.7)
MCV: 89 fL (ref 79–97)
Monocytes Absolute: 0.4 10*3/uL (ref 0.1–0.9)
Monocytes: 4 %
Neutrophils Absolute: 6.9 10*3/uL (ref 1.4–7.0)
Neutrophils: 71 %
Platelets: 321 10*3/uL (ref 150–450)
RBC: 4.02 x10E6/uL (ref 3.77–5.28)
RDW: 12.7 % (ref 11.7–15.4)
RPR Ser Ql: NONREACTIVE
Rh Factor: NEGATIVE
Rubella Antibodies, IGG: 4.34 index (ref >0.99)
WBC: 9.8 10*3/uL (ref 3.4–10.8)

## 2020-08-28 LAB — HCV AB W REFLEX TO QUANT PCR: HCV Ab: <0.1 s/co ratio (ref 0.0–0.9)

## 2020-08-28 LAB — HCV INTERPRETATION

## 2020-08-28 LAB — HGB FRACTIONATION CASCADE
Hgb A2: 3.1 % (ref 1.8–3.2)
Hgb A: 96.9 % (ref 96.4–98.8)
Hgb F: 0 % (ref 0.0–2.0)
Hgb S: 0 %

## 2020-08-28 LAB — VARICELLA ZOSTER ANTIBODY, IGG: Varicella zoster IgG: 524 index (ref >165)

## 2020-08-30 LAB — CULTURE, OB URINE

## 2020-08-30 LAB — URINE CULTURE, OB REFLEX

## 2020-09-11 ENCOUNTER — Other Ambulatory Visit (HOSPITAL_COMMUNITY)
Admission: RE | Admit: 2020-09-11 | Discharge: 2020-09-11 | Disposition: A | Discharge status: Home / Self Care | Payer: Medicaid Other | Source: Ambulatory Visit | Attending: Family Medicine | Admitting: Family Medicine

## 2020-09-11 ENCOUNTER — Other Ambulatory Visit: Payer: Self-pay

## 2020-09-11 ENCOUNTER — Ambulatory Visit: Payer: Medicaid Other | Admitting: Family Medicine

## 2020-09-11 VITALS — BP 110/70 | HR 104 | Wt 236.0 lb

## 2020-09-11 DIAGNOSIS — O26892 Other specified pregnancy related conditions, second trimester: Secondary | ICD-10-CM

## 2020-09-11 DIAGNOSIS — Z3490 Encounter for supervision of normal pregnancy, unspecified, unspecified trimester: Secondary | ICD-10-CM | POA: Diagnosis present

## 2020-09-11 DIAGNOSIS — Z3A17 17 weeks gestation of pregnancy: Secondary | ICD-10-CM

## 2020-09-11 DIAGNOSIS — R519 Headache, unspecified: Secondary | ICD-10-CM | POA: Diagnosis not present

## 2020-09-11 LAB — POCT 1 HR PRENATAL GLUCOSE: Glucose 1 Hr Prenatal, POC: 163 mg/dL

## 2020-09-11 NOTE — Progress Notes (Signed)
  Patient Name: Katherine Love Date of Birth: 10-06-2000 [facility] Prenatal Visit  Katherine Love is a 20 y.o. G2P0101 at [redacted]w[redacted]d here for routine follow up. She is dated by early ultrasound.  She reports nausea and does have cramping at least once a day. She also endorses migraines occurring about once every other day that are improved with turning off the lights. She endorses some swelling worse toward the end of the day after being on her feet. She denies vaginal bleeding.  See flow sheet for details.  Vitals:   09/11/20 1546  BP: 110/70  Pulse: (!) 104     A/P: Pregnancy at [redacted]w[redacted]d.  Doing well.    Routine Prenatal Care:  Dating reviewed, dating tab is correct Fetal heart tones Appropriate 143  Influenza vaccine not administered as not influenza season.   The patient has the following indication for screening preexisting diabetes: BMI >25 and history of GDM .States in the past she failed the 1 hour screen and did not do the 3 hour screen Anatomy ultrasound ordered to be scheduled at 18-20 weeks. Patient is interested in genetic screening if medicaid covers it. As she is past 13 weeks and 6 days, a Quad screen  was offered.  Pregnancy education including expected weight gain in pregnancy, OTC medication use, continued use of prenatal vitamin, smoking cessation if applicable, and nutrition in pregnancy.   Bleeding and pain precautions reviewed.  2. Pregnancy issues include the following and were addressed as appropriate today:  1 hour gtt performed and was elevated at 163. 3 hr gtt scheduled for 6/22 at 8:30am  Patient to get quad screen done when she comes in on 6/22 Pap smear with GC testing performed Recommended Tylenol for migraines and staying hydrated  Anatomy US scheduled Problem list  and pregnancy box updated: Yes.   Follow up 4 weeks for next OB visit

## 2020-09-11 NOTE — Patient Instructions (Signed)
It was great seeing you today!  Today we had your follow up OB visit and did you pap smear and STI testing. I will call you with any abnormal results.   You are scheduled for your anatomy ultrasound on 7/13. You can call [phone] prior to see if there are any cancellations if you would like to be seen sooner.   We completed your early 1 hr glucose test and because your glucose was elevated we have you scheduled for a 3 hour test on Wed morning. You can also complete your genetic screening test that morning when you come in as well.   Please check-out at the front desk before leaving the clinic. I'd like to see you back in 4 weeks for your next OB visit, but if you need to be seen earlier than that for any new issues we're happy to fit you in, just give [place] a call!   If you haven't already, sign up for My Chart to have easy access to your labs results, and communication with your primary care physician.  Feel free to call with any questions or concerns at any time, at [phone].   Take care,  Dr. Cora Collum [facility]

## 2020-09-13 ENCOUNTER — Other Ambulatory Visit: Payer: Medicaid Other

## 2020-09-13 LAB — CERVICOVAGINAL ANCILLARY ONLY
Chlamydia: NEGATIVE
Comment: NEGATIVE
Comment: NORMAL
Neisseria Gonorrhea: NEGATIVE

## 2020-09-14 LAB — CYTOLOGY - PAP: Diagnosis: NEGATIVE

## 2020-09-22 ENCOUNTER — Ambulatory Visit: Payer: Medicaid Other | Attending: Family Medicine

## 2020-09-22 ENCOUNTER — Other Ambulatory Visit: Payer: Self-pay

## 2020-09-22 ENCOUNTER — Other Ambulatory Visit: Payer: Self-pay | Admitting: Family Medicine

## 2020-09-22 DIAGNOSIS — Z3490 Encounter for supervision of normal pregnancy, unspecified, unspecified trimester: Secondary | ICD-10-CM | POA: Insufficient documentation

## 2020-09-26 ENCOUNTER — Other Ambulatory Visit: Payer: Self-pay | Admitting: *Deleted

## 2020-09-26 DIAGNOSIS — Z362 Encounter for other antenatal screening follow-up: Secondary | ICD-10-CM

## 2020-10-04 ENCOUNTER — Ambulatory Visit: Payer: Medicaid Other

## 2020-10-20 ENCOUNTER — Other Ambulatory Visit: Payer: Self-pay | Admitting: *Deleted

## 2020-10-20 ENCOUNTER — Ambulatory Visit: Payer: Medicaid Other | Attending: Obstetrics

## 2020-10-20 ENCOUNTER — Ambulatory Visit: Payer: Medicaid Other | Admitting: *Deleted

## 2020-10-20 ENCOUNTER — Other Ambulatory Visit: Payer: Self-pay

## 2020-10-20 VITALS — BP 124/73 | HR 93

## 2020-10-20 DIAGNOSIS — Z362 Encounter for other antenatal screening follow-up: Secondary | ICD-10-CM

## 2020-10-20 DIAGNOSIS — Z6839 Body mass index (BMI) 39.0-39.9, adult: Secondary | ICD-10-CM

## 2020-10-20 DIAGNOSIS — O99322 Drug use complicating pregnancy, second trimester: Secondary | ICD-10-CM | POA: Diagnosis not present

## 2020-10-20 DIAGNOSIS — Z363 Encounter for antenatal screening for malformations: Secondary | ICD-10-CM

## 2020-10-20 DIAGNOSIS — E669 Obesity, unspecified: Secondary | ICD-10-CM

## 2020-10-20 DIAGNOSIS — O09212 Supervision of pregnancy with history of pre-term labor, second trimester: Secondary | ICD-10-CM

## 2020-10-20 DIAGNOSIS — Z3A23 23 weeks gestation of pregnancy: Secondary | ICD-10-CM

## 2020-10-20 DIAGNOSIS — F1910 Other psychoactive substance abuse, uncomplicated: Secondary | ICD-10-CM | POA: Diagnosis not present

## 2020-10-20 DIAGNOSIS — O99212 Obesity complicating pregnancy, second trimester: Secondary | ICD-10-CM

## 2020-11-03 ENCOUNTER — Inpatient Hospital Stay (HOSPITAL_COMMUNITY)
Admission: EM | Admit: 2020-11-03 | Discharge: 2020-11-04 | Disposition: A | Discharge status: Home / Self Care | Payer: Medicaid Other | Attending: Obstetrics & Gynecology | Admitting: Obstetrics & Gynecology

## 2020-11-03 DIAGNOSIS — O26612 Liver and biliary tract disorders in pregnancy, second trimester: Secondary | ICD-10-CM | POA: Insufficient documentation

## 2020-11-03 DIAGNOSIS — O26892 Other specified pregnancy related conditions, second trimester: Secondary | ICD-10-CM | POA: Insufficient documentation

## 2020-11-03 DIAGNOSIS — O26899 Other specified pregnancy related conditions, unspecified trimester: Secondary | ICD-10-CM

## 2020-11-03 DIAGNOSIS — Z79899 Other long term (current) drug therapy: Secondary | ICD-10-CM | POA: Insufficient documentation

## 2020-11-03 DIAGNOSIS — R1011 Right upper quadrant pain: Secondary | ICD-10-CM | POA: Insufficient documentation

## 2020-11-03 DIAGNOSIS — R109 Unspecified abdominal pain: Secondary | ICD-10-CM

## 2020-11-03 DIAGNOSIS — Z3689 Encounter for other specified antenatal screening: Secondary | ICD-10-CM

## 2020-11-03 DIAGNOSIS — K8020 Calculus of gallbladder without cholecystitis without obstruction: Secondary | ICD-10-CM

## 2020-11-03 DIAGNOSIS — Z3A25 25 weeks gestation of pregnancy: Secondary | ICD-10-CM

## 2020-11-04 ENCOUNTER — Inpatient Hospital Stay (HOSPITAL_COMMUNITY): Payer: Medicaid Other

## 2020-11-04 ENCOUNTER — Encounter (HOSPITAL_COMMUNITY): Payer: Self-pay | Admitting: Obstetrics & Gynecology

## 2020-11-04 ENCOUNTER — Other Ambulatory Visit: Payer: Self-pay

## 2020-11-04 DIAGNOSIS — O26892 Other specified pregnancy related conditions, second trimester: Secondary | ICD-10-CM | POA: Diagnosis present

## 2020-11-04 DIAGNOSIS — K8020 Calculus of gallbladder without cholecystitis without obstruction: Secondary | ICD-10-CM

## 2020-11-04 DIAGNOSIS — Z3A25 25 weeks gestation of pregnancy: Secondary | ICD-10-CM

## 2020-11-04 DIAGNOSIS — O26612 Liver and biliary tract disorders in pregnancy, second trimester: Secondary | ICD-10-CM | POA: Diagnosis not present

## 2020-11-04 DIAGNOSIS — O99612 Diseases of the digestive system complicating pregnancy, second trimester: Secondary | ICD-10-CM | POA: Diagnosis not present

## 2020-11-04 DIAGNOSIS — Z79899 Other long term (current) drug therapy: Secondary | ICD-10-CM | POA: Diagnosis not present

## 2020-11-04 DIAGNOSIS — R1011 Right upper quadrant pain: Secondary | ICD-10-CM | POA: Diagnosis not present

## 2020-11-04 LAB — CBC
HCT: 33.7 % — ABNORMAL LOW (ref 36.0–46.0)
Hemoglobin: 11.5 g/dL — ABNORMAL LOW (ref 12.0–15.0)
MCH: 30.7 pg (ref 26.0–34.0)
MCHC: 34.1 g/dL (ref 30.0–36.0)
MCV: 89.9 fL (ref 80.0–100.0)
Platelets: 301 10*3/uL (ref 150–400)
RBC: 3.75 MIL/uL — ABNORMAL LOW (ref 3.87–5.11)
RDW: 13.3 % (ref 11.5–15.5)
WBC: 15.9 10*3/uL — ABNORMAL HIGH (ref 4.0–10.5)
nRBC: 0 % (ref 0.0–0.2)

## 2020-11-04 LAB — AMYLASE: Amylase: 70 U/L (ref 28–100)

## 2020-11-04 LAB — COMPREHENSIVE METABOLIC PANEL
ALT: 19 U/L (ref 0–44)
AST: 32 U/L (ref 15–41)
Albumin: 3 g/dL — ABNORMAL LOW (ref 3.5–5.0)
Alkaline Phosphatase: 94 U/L (ref 38–126)
Anion gap: 9 (ref 5–15)
BUN: 8 mg/dL (ref 6–20)
CO2: 23 mmol/L (ref 22–32)
Calcium: 8.9 mg/dL (ref 8.9–10.3)
Chloride: 103 mmol/L (ref 98–111)
Creatinine, Ser: 0.48 mg/dL (ref 0.44–1.00)
GFR, Estimated: >60 mL/min (ref >60)
Glucose, Bld: 109 mg/dL — ABNORMAL HIGH (ref 70–99)
Potassium: 3.7 mmol/L (ref 3.5–5.1)
Sodium: 135 mmol/L (ref 135–145)
Total Bilirubin: 0.6 mg/dL (ref 0.3–1.2)
Total Protein: 6.2 g/dL — ABNORMAL LOW (ref 6.5–8.1)

## 2020-11-04 LAB — URINALYSIS, ROUTINE W REFLEX MICROSCOPIC
Bacteria, UA: NONE SEEN
Bilirubin Urine: NEGATIVE
Glucose, UA: NEGATIVE mg/dL
Hgb urine dipstick: NEGATIVE
Ketones, ur: 20 mg/dL — AB
Nitrite: NEGATIVE
Protein, ur: 30 mg/dL — AB
Specific Gravity, Urine: 1.029 (ref 1.005–1.030)
pH: 5 (ref 5.0–8.0)

## 2020-11-04 LAB — LIPASE, BLOOD: Lipase: 28 U/L (ref 11–51)

## 2020-11-04 NOTE — ED Triage Notes (Signed)
Pt reported to ED with c/o abdominal pain and pelvic pain since earlier this evening. Pt reports being pregnant. Denies any bleeding at this time and denies any injury. States this is her second pregnancy, first pregnancy was uncomplicated.

## 2020-11-04 NOTE — MAU Note (Signed)
Pt reports pelvic pain and pressure began tonight and states she feels it more on the L side. Baby very active per.

## 2020-11-04 NOTE — MAU Provider Note (Signed)
History     CSN: [id number]  Arrival date and time: 11/03/20 2345   Event Date/Time   First Provider Initiated Contact with Patient 11/04/20 0146      Chief Complaint  Patient presents with   Pelvic Pain   Katherine Love is a 20 y.o. G2P0101 at 46w3d[facility]  She presents today for Pelvic and Upper Abdominal Pain.  She states the abdominal pain started around 2130 and was constant pain that she describes as "like my stomach was going to explode."  She then had nausea and vomiting despite eating tums.  She reports eating a couple hours prior to onset: Roll, Shrimp, Mac and Cheese, and Chicken Stuff. She reports pelvic pain that is "like sharp pressure when I walk."  She states the pain is "mostly when I walk." She reports she has to go from sitting to standing "very slowly."  She states she has not tried any interventions and does not wear a maternity belt or belly support band.  She endorses fetal movement.    OB History     Gravida  2   Para  1   Term  0   Preterm  1   AB  0   Living  1      SAB  0   IAB  0   Ectopic  0   Multiple  0   Live Births  1           Past Medical History:  Diagnosis Date   Anxiety    Depression    Headache     Past Surgical History:  Procedure Laterality Date   NO PAST SURGERIES      Family History  Problem Relation Age of Onset   Hypertension Mother     Social History   Tobacco Use   Smoking status: Never   Smokeless tobacco: Never  Vaping Use   Vaping Use: Never used  Substance Use Topics   Alcohol use: Never   Drug use: Yes    Frequency: 1.0 times per week    Types: Marijuana    Comment: last use October 31, 2020    Allergies: No Known Allergies  Medications Prior to Admission  Medication Sig Dispense Refill Last Dose   Prenatal Vit-Fe Fumarate-FA (MULTIVITAMIN-PRENATAL) 27-0.8 MG TABS tablet Take 1 tablet by mouth daily at 12 noon.   11/03/2020 at 2100   doxylamine,  Sleep, (UNISOM) 25 MG tablet Take 1 tablet (25 mg total) by mouth every 8 (eight) hours as needed. (Patient not taking: Reported on 10/20/2020) 30 tablet 0    promethazine (PHENERGAN) 12.5 MG tablet Take 1 tablet (12.5 mg total) by mouth every 6 (six) hours as needed for nausea or vomiting. For vomiting not controlled with B6 and Unisom (Patient not taking: Reported on 10/20/2020) 30 tablet 0    pyridOXINE (VITAMIN B-6) 25 MG tablet Take 1 tablet (25 mg total) by mouth every 8 (eight) hours. (Patient not taking: Reported on 10/20/2020) 30 tablet 0     Review of Systems  Gastrointestinal:  Positive for abdominal pain, diarrhea and nausea. Negative for constipation and vomiting.  Genitourinary:  Negative for difficulty urinating, dysuria, vaginal bleeding and vaginal discharge.  Neurological:  Negative for dizziness, light-headedness and headaches.  Physical Exam   Blood pressure 124/69, pulse 100, temperature 98.1 F (36.7 C), temperature source Oral, resp. rate 16, SpO2 100 %.  Physical Exam Constitutional:      General:  She is not in acute distress.    Appearance: Normal appearance. She is obese. She is not ill-appearing.  HENT:     Head: Normocephalic and atraumatic.  Eyes:     Conjunctiva/sclera: Conjunctivae normal.  Cardiovascular:     Rate and Rhythm: Normal rate and regular rhythm.     Heart sounds: Normal heart sounds.  Pulmonary:     Effort: Pulmonary effort is normal. No respiratory distress.     Breath sounds: Normal breath sounds.  Abdominal:     General: Bowel sounds are normal.     Tenderness: There is no abdominal tenderness.     Comments: Gravid, Appears LGA  Musculoskeletal:        General: Normal range of motion.     Cervical back: Normal range of motion.  Skin:    General: Skin is warm and dry.  Neurological:     Mental Status: She is alert and oriented to person, place, and time.  Psychiatric:        Mood and Affect: Mood normal.        Behavior: Behavior  normal.        Thought Content: Thought content normal.    Fetal Assessment 145 bpm, Mod Var, -Decels, +Accels Toco: No ctx graphed  MAU Course   Results for orders placed or performed during the hospital encounter of 11/03/20 (from the past 48 hour(s))  Urinalysis, Routine w reflex microscopic Urine, Clean Catch     Status: Abnormal   Collection Time: 11/04/20 12:51 AM  Result Value Ref Range   Color, Urine AMBER (A) YELLOW    Comment: BIOCHEMICALS MAY BE AFFECTED BY COLOR   APPearance HAZY (A) CLEAR   Specific Gravity, Urine 1.029 1.005 - 1.030   pH 5.0 5.0 - 8.0   Glucose, UA NEGATIVE NEGATIVE mg/dL   Hgb urine dipstick NEGATIVE NEGATIVE   Bilirubin Urine NEGATIVE NEGATIVE   Ketones, ur 20 (A) NEGATIVE mg/dL   Protein, ur 30 (A) NEGATIVE mg/dL   Nitrite NEGATIVE NEGATIVE   Leukocytes,Ua TRACE (A) NEGATIVE   RBC / HPF 0-5 0 - 5 RBC/hpf   WBC, UA 11-20 0 - 5 WBC/hpf   Bacteria, UA NONE SEEN NONE SEEN   Squamous Epithelial / LPF 6-10 0 - 5   Mucus PRESENT     Comment: Performed at [facility], [place] [address] [place], [place] [address]  Comprehensive metabolic panel     Status: Abnormal   Collection Time: 11/04/20  2:01 AM  Result Value Ref Range   Sodium 135 135 - 145 mmol/L   Potassium 3.7 3.5 - 5.1 mmol/L   Chloride 103 98 - 111 mmol/L   CO2 23 22 - 32 mmol/L   Glucose, Bld 109 (H) 70 - 99 mg/dL    Comment: Glucose reference range applies only to samples taken after fasting for at least 8 hours.   BUN 8 6 - 20 mg/dL   Creatinine, Ser 0.48 0.44 - 1.00 mg/dL   Calcium 8.9 8.9 - 10.3 mg/dL   Total Protein 6.2 (L) 6.5 - 8.1 g/dL   Albumin 3.0 (L) 3.5 - 5.0 g/dL   AST 32 15 - 41 U/L   ALT 19 0 - 44 U/L   Alkaline Phosphatase 94 38 - 126 U/L   Total Bilirubin 0.6 0.3 - 1.2 mg/dL   GFR, Estimated >60 >60 mL/min    Comment: (NOTE) Calculated using the CKD-EPI Creatinine Equation (2021)    Anion gap 9 5 -  15    Comment: Performed at [facility],  [place] [address] [place], [place] [address]  CBC     Status: Abnormal   Collection Time: 11/04/20  2:01 AM  Result Value Ref Range   WBC 15.9 (H) 4.0 - 10.5 K/uL   RBC 3.75 (L) 3.87 - 5.11 MIL/uL   Hemoglobin 11.5 (L) 12.0 - 15.0 g/dL   HCT 33.7 (L) 36.0 - 46.0 %   MCV 89.9 80.0 - 100.0 fL   MCH 30.7 26.0 - 34.0 pg   MCHC 34.1 30.0 - 36.0 g/dL   RDW 13.3 11.5 - 15.5 %   Platelets 301 150 - 400 K/uL   nRBC 0.0 0.0 - 0.2 %    Comment: Performed at [facility], [place] [address] [place], [place] [address]  Lipase, blood     Status: None   Collection Time: 11/04/20  2:01 AM  Result Value Ref Range   Lipase 28 11 - 51 U/L    Comment: Performed at [facility], [place] [address] [place], [place] [address]  Amylase     Status: None   Collection Time: 11/04/20  2:01 AM  Result Value Ref Range   Amylase 70 28 - 100 U/L    Comment: Performed at [place] [address] [place], [place] [address]     US Abdomen Limited  Result Date: 11/04/2020 CLINICAL DATA:  Nausea/vomiting, [redacted] weeks pregnant EXAM: ULTRASOUND ABDOMEN LIMITED RIGHT UPPER QUADRANT COMPARISON:  None. FINDINGS: Gallbladder: 12 mm gallstone in the gallbladder neck. No gallbladder wall thickening or pericholecystic fluid. Negative sonographic Murphy's sign. Common bile duct: Diameter: 4 mm Liver: No focal lesion identified. Within normal limits in parenchymal echogenicity. Portal vein is patent on color Doppler imaging with normal direction of blood flow towards the liver. Other: None. IMPRESSION: Cholelithiasis, without associated sonographic findings to suggest acute cholecystitis. Electronically Signed   By: Julian Hy M.D.   On: 11/04/2020 02:58     MDM PE Labs: CBC/D, CMP, Amylase, Lipase EFM Ultrasound-Abdominal Assessment and Plan  20 year old G2P0101  SIUP at 25.3 weeks Cat I FT RUQ Pain Likely Cholelithiasis  -POC Reviewed. -Informed that complaints are highly suspicious for gallstones.   -Patient without current pain and declines pain medication.  -Will collect labs and send for [place]. -NST Reactive .  -Will await results and reassess.  Maryann Conners MSN, CNM 11/04/2020, 1:46 AM   Reassessment (3:55 AM)  -Results return as above. -Discharge orders placed with gallstone information including advised eating plan. -Dr. Darlin Priestly contacted and provider requests she presents to bedside to educate patient on findings and follow up as scheduled.  Maryann Conners MSN, CNM Advanced Practice Provider, [facility] for [facility]

## 2020-11-04 NOTE — MAU Note (Signed)
Pt sent from main ED after presenting there with pelvic pain. Pt reports earlier today she began having mid abd pain and havd some vomiting. That has since subsided but now she is having pelvic pain but only when she is walking or changing positions. Denies bleeding.

## 2020-11-04 NOTE — ED Provider Notes (Signed)
MSE was initiated and I personally evaluated the patient and placed orders (if any) at  12:07 AM on November 04, 2020.  G2P1 patient, approximately 6 months, prenatal care thru [facility] here with upper and lower abdominal pain. No vaginal bleeding or discharge. Started today. No abdominal injury.   Today's Vitals   11/04/20 0002  BP: 119/72  Pulse: (!) 101  Resp: 18  Temp: 97.8 F (36.6 C)  TempSrc: Oral  SpO2: 100%   There is no height or weight on file to calculate BMI.  In NAD Gravid abdomen  Discussed with MAU provider who accepts the patient in transfer.   The patient appears stable so that the remainder of the MSE may be completed by another provider.   Elpidio Anis, PA-C 11/04/20 0008    Mesner, Barbara Cower, MD 11/04/20 0130

## 2020-12-15 ENCOUNTER — Other Ambulatory Visit: Payer: Self-pay

## 2020-12-15 ENCOUNTER — Ambulatory Visit: Payer: Medicaid Other | Admitting: *Deleted

## 2020-12-15 ENCOUNTER — Encounter: Payer: Self-pay | Admitting: *Deleted

## 2020-12-15 ENCOUNTER — Ambulatory Visit: Payer: Medicaid Other | Attending: Obstetrics and Gynecology

## 2020-12-15 VITALS — BP 138/67 | HR 90

## 2020-12-15 DIAGNOSIS — O99213 Obesity complicating pregnancy, third trimester: Secondary | ICD-10-CM

## 2020-12-15 DIAGNOSIS — O09893 Supervision of other high risk pregnancies, third trimester: Secondary | ICD-10-CM | POA: Insufficient documentation

## 2020-12-15 DIAGNOSIS — E669 Obesity, unspecified: Secondary | ICD-10-CM | POA: Diagnosis not present

## 2020-12-15 DIAGNOSIS — Z362 Encounter for other antenatal screening follow-up: Secondary | ICD-10-CM | POA: Diagnosis not present

## 2020-12-15 DIAGNOSIS — O99323 Drug use complicating pregnancy, third trimester: Secondary | ICD-10-CM | POA: Diagnosis not present

## 2020-12-15 DIAGNOSIS — Z3A31 31 weeks gestation of pregnancy: Secondary | ICD-10-CM

## 2020-12-15 DIAGNOSIS — F1910 Other psychoactive substance abuse, uncomplicated: Secondary | ICD-10-CM | POA: Diagnosis not present

## 2020-12-18 ENCOUNTER — Telehealth: Payer: Self-pay | Admitting: Family Medicine

## 2020-12-18 NOTE — Telephone Encounter (Signed)
Attempted to call patient (x2) to check on her since she has been an OB patient of mine but we have not seen her in the clinic for a while (6/20). Phone does not ring and just gives busy signal. Will try again later today.

## 2020-12-20 ENCOUNTER — Other Ambulatory Visit: Payer: Self-pay

## 2020-12-20 ENCOUNTER — Inpatient Hospital Stay (HOSPITAL_COMMUNITY)
Admission: AD | Admit: 2020-12-20 | Discharge: 2020-12-20 | Disposition: A | Discharge status: Home / Self Care | Payer: Medicaid Other | Attending: Obstetrics & Gynecology | Admitting: Obstetrics & Gynecology

## 2020-12-20 ENCOUNTER — Encounter (HOSPITAL_COMMUNITY): Payer: Self-pay | Admitting: Obstetrics & Gynecology

## 2020-12-20 DIAGNOSIS — Z3A32 32 weeks gestation of pregnancy: Secondary | ICD-10-CM | POA: Diagnosis not present

## 2020-12-20 DIAGNOSIS — O26899 Other specified pregnancy related conditions, unspecified trimester: Secondary | ICD-10-CM

## 2020-12-20 DIAGNOSIS — R109 Unspecified abdominal pain: Secondary | ICD-10-CM

## 2020-12-20 DIAGNOSIS — M549 Dorsalgia, unspecified: Secondary | ICD-10-CM | POA: Insufficient documentation

## 2020-12-20 DIAGNOSIS — O26893 Other specified pregnancy related conditions, third trimester: Secondary | ICD-10-CM | POA: Diagnosis not present

## 2020-12-20 LAB — URINALYSIS, ROUTINE W REFLEX MICROSCOPIC
Bilirubin Urine: NEGATIVE
Glucose, UA: NEGATIVE mg/dL
Ketones, ur: 5 mg/dL — AB
Nitrite: NEGATIVE
Protein, ur: 30 mg/dL — AB
Specific Gravity, Urine: 1.025 (ref 1.005–1.030)
pH: 5 (ref 5.0–8.0)

## 2020-12-20 MED ORDER — CYCLOBENZAPRINE HCL 5 MG PO TABS: 5.0000 mg | ORAL_TABLET | Freq: Three times a day (TID) | ORAL | 0 refills | Status: DC | PRN

## 2020-12-20 MED ORDER — CYCLOBENZAPRINE HCL 5 MG PO TABS
10.0000 mg | ORAL_TABLET | Freq: Once | ORAL | Status: AC
  Administered 2020-12-20: 10 mg via ORAL
  Filled 2020-12-20: qty 2

## 2020-12-20 NOTE — Discharge Instructions (Signed)

## 2020-12-20 NOTE — MAU Provider Note (Signed)
History     CSN: [id number]  Arrival date and time: 12/20/20 1820   Event Date/Time   First Provider Initiated Contact with Patient 12/20/20 1850      Chief Complaint  Patient presents with   Abdominal Pain   Back Pain   Contractions   HPI Katherine Love is a 20 y.o. G2P0101 at [redacted]w[redacted]d who presents with back pain and abdominal pain. She reports the back pain has been ongoing but reports it has gotten worse today. She rates the pain is a 4/10 and has not tried anything for the pain. She also reports intermittent abdominal pain that she states feels like tightening. She denies any leaking or bleeding. Reports normal fetal movement.   She gets care at [facility] but has not been seen since 17 weeks. She states she is planning on returning there.   OB History     Gravida  2   Para  1   Term  0   Preterm  1   AB  0   Living  1      SAB  0   IAB  0   Ectopic  0   Multiple  0   Live Births  1           Past Medical History:  Diagnosis Date   Anxiety    Depression    Headache     Past Surgical History:  Procedure Laterality Date   NO PAST SURGERIES      Family History  Problem Relation Age of Onset   Hypertension Mother     Social History   Tobacco Use   Smoking status: Never   Smokeless tobacco: Never  Vaping Use   Vaping Use: Never used  Substance Use Topics   Alcohol use: Never   Drug use: Yes    Frequency: 1.0 times per week    Types: Marijuana    Allergies: No Known Allergies  Medications Prior to Admission  Medication Sig Dispense Refill Last Dose   Prenatal Vit-Fe Fumarate-FA (MULTIVITAMIN-PRENATAL) 27-0.8 MG TABS tablet Take 1 tablet by mouth daily at 12 noon.   Past Month    Review of Systems  Constitutional: Negative.  Negative for fatigue and fever.  HENT: Negative.    Respiratory: Negative.  Negative for shortness of breath.   Cardiovascular: Negative.  Negative for chest pain.  Gastrointestinal:  Positive for abdominal  pain. Negative for constipation, diarrhea, nausea and vomiting.  Genitourinary: Negative.  Negative for dysuria, vaginal bleeding and vaginal discharge.  Musculoskeletal:  Positive for back pain.  Neurological: Negative.  Negative for dizziness and headaches.  Physical Exam   Blood pressure 138/79, pulse 97, temperature 98.7 F (37.1 C), temperature source Oral, resp. rate 18, SpO2 99 %.  Physical Exam Vitals and nursing note reviewed.  Constitutional:      General: She is not in acute distress.    Appearance: She is well-developed.  HENT:     Head: Normocephalic.  Eyes:     Pupils: Pupils are equal, round, and reactive to light.  Cardiovascular:     Rate and Rhythm: Normal rate and regular rhythm.     Heart sounds: Normal heart sounds.  Pulmonary:     Effort: Pulmonary effort is normal. No respiratory distress.     Breath sounds: Normal breath sounds.  Abdominal:     General: Bowel sounds are normal. There is no distension.     Palpations: Abdomen is soft.     Tenderness:  There is no abdominal tenderness.  Skin:    General: Skin is warm and dry.  Neurological:     Mental Status: She is alert and oriented to person, place, and time.  Psychiatric:        Mood and Affect: Mood normal.        Behavior: Behavior normal.        Thought Content: Thought content normal.        Judgment: Judgment normal.   Fetal Tracing:  Baseline:  135 Variability: moderate Accels: 15x15 Decels: none  Toco: ui  MAU Course  Procedures Results for orders placed or performed during the hospital encounter of 12/20/20 (from the past 24 hour(s))  Urinalysis, Routine w reflex microscopic Urine, Clean Catch     Status: Abnormal   Collection Time: 12/20/20  6:33 PM  Result Value Ref Range   Color, Urine YELLOW YELLOW   APPearance HAZY (A) CLEAR   Specific Gravity, Urine 1.025 1.005 - 1.030   pH 5.0 5.0 - 8.0   Glucose, UA NEGATIVE NEGATIVE mg/dL   Hgb urine dipstick MODERATE (A) NEGATIVE    Bilirubin Urine NEGATIVE NEGATIVE   Ketones, ur 5 (A) NEGATIVE mg/dL   Protein, ur 30 (A) NEGATIVE mg/dL   Nitrite NEGATIVE NEGATIVE   Leukocytes,Ua SMALL (A) NEGATIVE   RBC / HPF 11-20 0 - 5 RBC/hpf   WBC, UA 0-5 0 - 5 WBC/hpf   Bacteria, UA RARE (A) NONE SEEN   Squamous Epithelial / LPF 11-20 0 - 5   Mucus PRESENT    Ca Oxalate Crys, UA PRESENT      MDM UA No contractions noted on monitoring. Cervix closed. No signs of preterm labor at this time.  Flexeril PO Some blood in UA but pain improving and not characteristic of stones. Low suspicion at this time.   Assessment and Plan   1. Abdominal pain affecting pregnancy   2. [redacted] weeks gestation of pregnancy    -Discharge home in stable condition -Rx for flexeril sent to patient's pharmacy -Preterm labor precautions discussed -Patient advised to follow-up with OB as scheduled for prenatal care -Patient may return to MAU as needed or if her condition were to change or worsen   Rolm Bookbinder CNM 12/20/2020, 6:51 PM

## 2020-12-20 NOTE — MAU Note (Signed)
Pt reports she is having a lot of cramping x 2 weeks, worsening over the last 2 days. Having contractions and pressure any time she is walking today. Denies bleeding or ROM. Reports good fetal movement.

## 2021-02-06 ENCOUNTER — Encounter (HOSPITAL_COMMUNITY): Payer: Self-pay | Admitting: Obstetrics & Gynecology

## 2021-02-06 ENCOUNTER — Inpatient Hospital Stay (HOSPITAL_COMMUNITY)
Admission: AD | Admit: 2021-02-06 | Discharge: 2021-02-08 | DRG: 805 | Disposition: A | Discharge status: Home / Self Care | Payer: Medicaid Other | Attending: Family Medicine | Admitting: Family Medicine

## 2021-02-06 ENCOUNTER — Other Ambulatory Visit: Payer: Self-pay

## 2021-02-06 DIAGNOSIS — O163 Unspecified maternal hypertension, third trimester: Secondary | ICD-10-CM | POA: Diagnosis present

## 2021-02-06 DIAGNOSIS — Z3A38 38 weeks gestation of pregnancy: Secondary | ICD-10-CM | POA: Diagnosis not present

## 2021-02-06 DIAGNOSIS — O99214 Obesity complicating childbirth: Secondary | ICD-10-CM | POA: Diagnosis present

## 2021-02-06 DIAGNOSIS — O0930 Supervision of pregnancy with insufficient antenatal care, unspecified trimester: Secondary | ICD-10-CM

## 2021-02-06 DIAGNOSIS — U071 COVID-19: Secondary | ICD-10-CM | POA: Diagnosis present

## 2021-02-06 DIAGNOSIS — O9852 Other viral diseases complicating childbirth: Secondary | ICD-10-CM | POA: Diagnosis present

## 2021-02-06 DIAGNOSIS — O368130 Decreased fetal movements, third trimester, not applicable or unspecified: Secondary | ICD-10-CM | POA: Diagnosis present

## 2021-02-06 DIAGNOSIS — Z8751 Personal history of pre-term labor: Secondary | ICD-10-CM

## 2021-02-06 DIAGNOSIS — F1290 Cannabis use, unspecified, uncomplicated: Secondary | ICD-10-CM | POA: Diagnosis present

## 2021-02-06 DIAGNOSIS — Z6791 Unspecified blood type, Rh negative: Secondary | ICD-10-CM | POA: Diagnosis not present

## 2021-02-06 DIAGNOSIS — O134 Gestational [pregnancy-induced] hypertension without significant proteinuria, complicating childbirth: Secondary | ICD-10-CM | POA: Diagnosis present

## 2021-02-06 DIAGNOSIS — O99324 Drug use complicating childbirth: Secondary | ICD-10-CM | POA: Diagnosis present

## 2021-02-06 DIAGNOSIS — O26893 Other specified pregnancy related conditions, third trimester: Secondary | ICD-10-CM | POA: Diagnosis present

## 2021-02-06 DIAGNOSIS — O99210 Obesity complicating pregnancy, unspecified trimester: Secondary | ICD-10-CM | POA: Diagnosis present

## 2021-02-06 LAB — COMPREHENSIVE METABOLIC PANEL
ALT: 11 U/L (ref 0–44)
AST: 17 U/L (ref 15–41)
Albumin: 2.7 g/dL — ABNORMAL LOW (ref 3.5–5.0)
Alkaline Phosphatase: 122 U/L (ref 38–126)
Anion gap: 11 (ref 5–15)
BUN: 8 mg/dL (ref 6–20)
CO2: 19 mmol/L — ABNORMAL LOW (ref 22–32)
Calcium: 8.8 mg/dL — ABNORMAL LOW (ref 8.9–10.3)
Chloride: 105 mmol/L (ref 98–111)
Creatinine, Ser: 0.58 mg/dL (ref 0.44–1.00)
GFR, Estimated: >60 mL/min (ref >60)
Glucose, Bld: 129 mg/dL — ABNORMAL HIGH (ref 70–99)
Potassium: 3.6 mmol/L (ref 3.5–5.1)
Sodium: 135 mmol/L (ref 135–145)
Total Bilirubin: 0.5 mg/dL (ref 0.3–1.2)
Total Protein: 6.2 g/dL — ABNORMAL LOW (ref 6.5–8.1)

## 2021-02-06 LAB — URINALYSIS, ROUTINE W REFLEX MICROSCOPIC
Bilirubin Urine: NEGATIVE
Glucose, UA: NEGATIVE mg/dL
Hgb urine dipstick: NEGATIVE
Ketones, ur: NEGATIVE mg/dL
Nitrite: NEGATIVE
Protein, ur: NEGATIVE mg/dL
Specific Gravity, Urine: 1.006 (ref 1.005–1.030)
pH: 6 (ref 5.0–8.0)

## 2021-02-06 LAB — RESP PANEL BY RT-PCR (FLU A&B, COVID) ARPGX2
Influenza A by PCR: NEGATIVE
Influenza B by PCR: NEGATIVE
SARS Coronavirus 2 by RT PCR: POSITIVE — AB

## 2021-02-06 LAB — CBC
HCT: 37.3 % (ref 36.0–46.0)
Hemoglobin: 12.9 g/dL (ref 12.0–15.0)
MCH: 30.4 pg (ref 26.0–34.0)
MCHC: 34.6 g/dL (ref 30.0–36.0)
MCV: 88 fL (ref 80.0–100.0)
Platelets: 308 10*3/uL (ref 150–400)
RBC: 4.24 MIL/uL (ref 3.87–5.11)
RDW: 13.6 % (ref 11.5–15.5)
WBC: 9.7 10*3/uL (ref 4.0–10.5)
nRBC: 0 % (ref 0.0–0.2)

## 2021-02-06 LAB — PROTEIN / CREATININE RATIO, URINE
Creatinine, Urine: 65.32 mg/dL
Protein Creatinine Ratio: 0.18 mg/mg{Cre} — ABNORMAL HIGH (ref 0.00–0.15)
Total Protein, Urine: 12 mg/dL

## 2021-02-06 LAB — HEMOGLOBIN A1C
Hgb A1c MFr Bld: 4.9 % (ref 4.8–5.6)
Mean Plasma Glucose: 93.93 mg/dL

## 2021-02-06 LAB — TYPE AND SCREEN
ABO/RH(D): O NEG
Antibody Screen: NEGATIVE

## 2021-02-06 LAB — WET PREP, GENITAL
Clue Cells Wet Prep HPF POC: NONE SEEN
Sperm: NONE SEEN
Trich, Wet Prep: NONE SEEN
WBC, Wet Prep HPF POC: >=10 — AB (ref <10)
Yeast Wet Prep HPF POC: NONE SEEN

## 2021-02-06 LAB — POCT FERN TEST: POCT Fern Test: NEGATIVE

## 2021-02-06 LAB — GROUP B STREP BY PCR: Group B strep by PCR: NEGATIVE

## 2021-02-06 MED ORDER — LACTATED RINGERS IV SOLN: INTRAVENOUS | Status: DC

## 2021-02-06 NOTE — MAU Provider Note (Signed)
History     CSN: [id number]  Arrival date and time: 02/06/21 1417   Event Date/Time   First Provider Initiated Contact with Patient 02/06/21 1520      Chief Complaint  Patient presents with   Abdominal Pain   Contractions   Pelvic Pain   Decreased Fetal Movement   20 y.o. G2P0101 @38 .6 wks presenting with pelvic pain, back pain, and pressure over the last 2 days. Reports decreased FM today. Reports few episodes of leaking fluid over the last week. Reports irregular ctx. No VB. Denies HA, visual disturbances, RUQ pain, SOB, and CP. Had 2 prenatal visits at [facility].      OB History     Gravida  2   Para  1   Term  0   Preterm  1   AB  0   Living  1      SAB  0   IAB  0   Ectopic  0   Multiple  0   Live Births  1           Past Medical History:  Diagnosis Date   Anxiety    Depression    Headache     Past Surgical History:  Procedure Laterality Date   NO PAST SURGERIES      Family History  Problem Relation Age of Onset   Hypertension Mother     Social History   Tobacco Use   Smoking status: Never   Smokeless tobacco: Never  Vaping Use   Vaping Use: Never used  Substance Use Topics   Alcohol use: Never   Drug use: Yes    Frequency: 1.0 times per week    Types: Marijuana    Allergies: No Known Allergies  Medications Prior to Admission  Medication Sig Dispense Refill Last Dose   cyclobenzaprine (FLEXERIL) 5 MG tablet Take 1 tablet (5 mg total) by mouth 3 (three) times daily as needed for muscle spasms. 30 tablet 0 Past Month   Prenatal Vit-Fe Fumarate-FA (MULTIVITAMIN-PRENATAL) 27-0.8 MG TABS tablet Take 1 tablet by mouth daily at 12 noon.   Past Month    Review of Systems  Eyes:  Negative for visual disturbance.  Gastrointestinal:  Negative for abdominal pain.  Genitourinary:  Positive for pelvic pain.  Musculoskeletal:  Positive for back pain.  Neurological:  Negative for headaches.  Physical Exam   Blood pressure (!)  144/79, pulse (!) 103, temperature 98.3 F (36.8 C), temperature source Oral, resp. rate 16, weight 113.9 kg, SpO2 99 %. Patient Vitals for the past 24 hrs:  BP Temp Temp src Pulse Resp SpO2 Weight  02/06/21 1630 -- -- -- -- -- 99 % --  02/06/21 1600 118/81 -- -- 99 -- 99 % --  02/06/21 1545 (!) 123/104 -- -- (!) 102 -- 99 % --  02/06/21 1535 140/84 -- -- 89 -- 99 % --  02/06/21 1530 -- -- -- -- -- 100 % --  02/06/21 1505 (!) 144/79 -- -- (!) 103 -- 99 % --  02/06/21 1500 -- -- -- -- -- 100 % --  02/06/21 1444 131/81 98.3 F (36.8 C) Oral (!) 106 16 99 % 113.9 kg    Physical Exam Vitals and nursing note reviewed. Exam conducted with a chaperone present.  Constitutional:      General: She is not in acute distress.    Appearance: Normal appearance.  HENT:     Head: Normocephalic and atraumatic.  Pulmonary:  Effort: Pulmonary effort is normal. No respiratory distress.  Genitourinary:    Comments: SSE: no pool SVE: 1/th/-3/vtx Musculoskeletal:        General: Normal range of motion.     Cervical back: Normal range of motion.  Skin:    General: Skin is warm and dry.  Neurological:     General: No focal deficit present.     Mental Status: She is alert and oriented to person, place, and time.  Psychiatric:        Mood and Affect: Mood normal.        Behavior: Behavior normal.  EFM: 145 bpm, mod variability, + accels, no decels Toco: rare  Results for orders placed or performed during the hospital encounter of 02/06/21 (from the past 24 hour(s))  Wet prep, genital     Status: Abnormal   Collection Time: 02/06/21  3:46 PM   Specimen: PATH Cytology Cervicovaginal Ancillary Only  Result Value Ref Range   Yeast Wet Prep HPF POC NONE SEEN NONE SEEN   Trich, Wet Prep NONE SEEN NONE SEEN   Clue Cells Wet Prep HPF POC NONE SEEN NONE SEEN   WBC, Wet Prep HPF POC >=10 (A) <10   Sperm NONE SEEN   Type and screen [place]     Status: None (Preliminary result)    Collection Time: 02/06/21  3:54 PM  Result Value Ref Range   ABO/RH(D) PENDING    Antibody Screen PENDING    Sample Expiration      02/09/2021,2359 Performed at [facility], [address], [place], [place] [address]    MAU Course  Procedures  MDM Labs ordered. Admit for HTN and DFM.   Assessment and Plan  38.[redacted] weeks gestation Reactive NST Elevated BP Admit to LD Mngt per labor team-notified  Julianne Handler 02/06/2021, 3:26 PM

## 2021-02-06 NOTE — MAU Note (Signed)
...  Katherine Love is a 20 y.o. at [redacted]w[redacted]d here in MAU reporting on-going pelvic, back and abdominal pain. Denies LOF or vaginal bleeding. Pt states that FM is decreased, but endorses FM since arrival to MAU.  Onset of complaint: On-going  Pain score: 8 with ctx and 5 in between ctx. There were no vitals filed for this visit. FHT: 142

## 2021-02-06 NOTE — H&P (Addendum)
OB ADMISSION/ HISTORY & PHYSICAL:  Admission Date: 02/06/2021  2:17 PM  Admit Diagnosis: CRAMPING,PRESSURE,DFM    Katherine Love is a 20 y.o. female at [redacted]w[redacted]d presenting for decreased FM and mildly elevated BP 140s/100s with limited prenatal care. Patient denies HA, visual disturbances, RUQ pain. Hx of preterm delivery at 36 weeks. Reports painful irregular ctx and vaginal pressure. Patient reports +FM since arrival, denies LOF and vaginal bleeding. Significant other Dominique at bedside and supportive. Expecting baby boy Elijah.   Prenatal History: G2P0101   EDC : 02/14/2021, by Ultrasound  Limited Prenatal care at [facility] Xxx x 2 visits   Prenatal course complicated by: Limited prenatal care Obesity RH Negative Hx preterm delivery at 36 weeks   Prenatal Labs: ABO, Rh: O (06/03 1704)  Antibody: NEG (11/15 1554) Rubella: 4.34 (06/03 1704)  RPR: Non Reactive (06/03 1704)  HBsAg: Negative (06/03 1704)  HIV: Non Reactive (06/03 1704)  Hep C Antibody Neg GBS: pending   Genetic Screening: declined Ultrasound: Normal    Genetic Screening: Declined Maternal Ultrasounds/Referrals: Normal Fetal Ultrasounds or other Referrals:  None Maternal Substance Abuse:  Yes:  Type: Marijuana Significant Maternal Medications:  None Significant Maternal Lab Results:  Other:  Other Comments:  None  Medical / Surgical History : Past medical history:  Past Medical History:  Diagnosis Date   Anxiety    Depression    Headache     Past surgical history:  Past Surgical History:  Procedure Laterality Date   NO PAST SURGERIES      Family History:  Family History  Problem Relation Age of Onset   Hypertension Mother     Social History:  reports that she has never smoked. She has never used smokeless tobacco. She reports current drug use. Frequency: 1.00 time per week. Drug: Marijuana. She reports that she does not drink alcohol. Allergies: Patient has no known allergies.   Current  Medications at time of admission:  Medications Prior to Admission  Medication Sig Dispense Refill Last Dose   cyclobenzaprine (FLEXERIL) 5 MG tablet Take 1 tablet (5 mg total) by mouth 3 (three) times daily as needed for muscle spasms. 30 tablet 0 Past Month   Prenatal Vit-Fe Fumarate-FA (MULTIVITAMIN-PRENATAL) 27-0.8 MG TABS tablet Take 1 tablet by mouth daily at 12 noon.   Past Month     Review of Systems: Review of Systems  Constitutional: Negative.   HENT: Negative.    Eyes: Negative.   Respiratory: Negative.    Cardiovascular: Negative.   Neurological: Negative.   Psychiatric/Behavioral: Negative.     Physical Exam: Vital signs and nursing notes reviewed.  Patient Vitals for the past 24 hrs:  BP Temp Temp src Pulse Resp SpO2 Weight  02/06/21 1715 120/70 -- -- 91 -- 99 % --  02/06/21 1700 127/84 -- -- 92 -- 99 % --  02/06/21 1645 126/73 -- -- 98 -- 98 % --  02/06/21 1630 138/83 -- -- 100 -- 99 % --  02/06/21 1615 (!) 125/106 -- -- (!) 105 -- 99 % --  02/06/21 1600 118/81 -- -- 99 -- 99 % --  02/06/21 1545 (!) 123/104 -- -- (!) 102 -- 99 % --  02/06/21 1535 140/84 -- -- 89 -- 99 % --  02/06/21 1530 -- -- -- -- -- 100 % --  02/06/21 1505 (!) 144/79 -- -- (!) 103 -- 99 % --  02/06/21 1500 -- -- -- -- -- 100 % --  02/06/21 1444 131/81 98.3 F (36.8  C) Oral (!) 106 16 99 % 113.9 kg     General: AAO x 3, NAD Heart: RRR Lungs:CTAB Abdomen: Gravid, NT Extremities: mild LE edema, nonpitting  Genitalia / VE:     FHR: 140 BPM, moderate variability, + accels, no decels TOCO: irreg contractions   Labs:   Pending T&S, CBC, RPR  Recent Labs    02/06/21 1554  WBC 9.7  HGB 12.9  HCT 37.3  PLT 308    Assessment:  20 y.o. G2P0101 at [redacted]w[redacted]d admitted for decreased fetal movement and mildly elevated BP and decreased FM with limited prenatal care and hx of preterm delivery at 36 weeks.   1. 1st stage of labor 2. FHR category 1 3. Preeclampsia labs 4. GBS pending 5.  Desires Epidural 6. Plans to Breastfeed  Plan:  1. Admit to BS 2. Routine L&D orders 3. Analgesia/anesthesia PRN  4. Expectant management 5. Anticipate NSVB   Trellis Moment SNM 02/06/2021, 5:43 PM   Attestation of Supervision of Student:  I confirm that I have verified the information documented in the nurse midwife student's note and that I have also personally reperformed the history, physical exam and all medical decision making activities.  I have verified that all services and findings are accurately documented in this student's note; and I agree with management and plan as outlined in the documentation. I have also made any necessary editorial changes.  Rolm Bookbinder, CNM [facility] [employer], [facility] 02/07/2021 12:27 AM

## 2021-02-07 ENCOUNTER — Inpatient Hospital Stay (HOSPITAL_COMMUNITY): Payer: Medicaid Other | Admitting: Anesthesiology

## 2021-02-07 ENCOUNTER — Other Ambulatory Visit: Payer: Self-pay | Admitting: Family Medicine

## 2021-02-07 ENCOUNTER — Encounter (HOSPITAL_COMMUNITY): Payer: Self-pay | Admitting: Family Medicine

## 2021-02-07 DIAGNOSIS — O99324 Drug use complicating childbirth: Secondary | ICD-10-CM

## 2021-02-07 DIAGNOSIS — O368130 Decreased fetal movements, third trimester, not applicable or unspecified: Secondary | ICD-10-CM

## 2021-02-07 DIAGNOSIS — Z3A38 38 weeks gestation of pregnancy: Secondary | ICD-10-CM

## 2021-02-07 DIAGNOSIS — O163 Unspecified maternal hypertension, third trimester: Secondary | ICD-10-CM | POA: Diagnosis present

## 2021-02-07 DIAGNOSIS — O134 Gestational [pregnancy-induced] hypertension without significant proteinuria, complicating childbirth: Secondary | ICD-10-CM

## 2021-02-07 LAB — RAPID URINE DRUG SCREEN, HOSP PERFORMED
Amphetamines: NOT DETECTED
Barbiturates: NOT DETECTED
Benzodiazepines: NOT DETECTED
Cocaine: NOT DETECTED
Opiates: NOT DETECTED
Tetrahydrocannabinol: POSITIVE — AB

## 2021-02-07 LAB — RPR: RPR Ser Ql: NONREACTIVE

## 2021-02-07 LAB — GC/CHLAMYDIA PROBE AMP (~~LOC~~) NOT AT ARMC
Chlamydia: NEGATIVE
Comment: NEGATIVE
Comment: NORMAL
Neisseria Gonorrhea: NEGATIVE

## 2021-02-07 MED ORDER — OXYTOCIN-SODIUM CHLORIDE 30-0.9 UT/500ML-% IV SOLN
2.5000 [IU]/h | INTRAVENOUS | Status: DC
  Filled 2021-02-07: qty 500

## 2021-02-07 MED ORDER — DIPHENHYDRAMINE HCL 50 MG/ML IJ SOLN: 12.5000 mg | INTRAMUSCULAR | Status: DC | PRN

## 2021-02-07 MED ORDER — EPHEDRINE 5 MG/ML INJ: 10.0000 mg | INTRAVENOUS | Status: DC | PRN

## 2021-02-07 MED ORDER — FENTANYL-BUPIVACAINE-NACL 0.5-0.125-0.9 MG/250ML-% EP SOLN
12.0000 mL/h | EPIDURAL | Status: DC | PRN
  Administered 2021-02-07: 12 mL/h via EPIDURAL
  Filled 2021-02-07: qty 250

## 2021-02-07 MED ORDER — ACETAMINOPHEN 325 MG PO TABS: 650.0000 mg | ORAL_TABLET | ORAL | Status: DC | PRN

## 2021-02-07 MED ORDER — LACTATED RINGERS IV SOLN: 500.0000 mL | Freq: Once | INTRAVENOUS | Status: DC

## 2021-02-07 MED ORDER — SOD CITRATE-CITRIC ACID 500-334 MG/5ML PO SOLN: 30.0000 mL | ORAL | Status: DC | PRN

## 2021-02-07 MED ORDER — PHENYLEPHRINE 40 MCG/ML (10ML) SYRINGE FOR IV PUSH (FOR BLOOD PRESSURE SUPPORT)
80.0000 ug | PREFILLED_SYRINGE | INTRAVENOUS | Status: DC | PRN
  Filled 2021-02-07: qty 10

## 2021-02-07 MED ORDER — LIDOCAINE HCL (PF) 1 % IJ SOLN: 30.0000 mL | INTRAMUSCULAR | Status: DC | PRN

## 2021-02-07 MED ORDER — OXYCODONE-ACETAMINOPHEN 5-325 MG PO TABS: 2.0000 | ORAL_TABLET | ORAL | Status: DC | PRN

## 2021-02-07 MED ORDER — OXYTOCIN-SODIUM CHLORIDE 30-0.9 UT/500ML-% IV SOLN
1.0000 m[IU]/min | INTRAVENOUS | Status: DC
  Administered 2021-02-07: 2 m[IU]/min via INTRAVENOUS

## 2021-02-07 MED ORDER — TETANUS-DIPHTH-ACELL PERTUSSIS 5-2.5-18.5 LF-MCG/0.5 IM SUSY: 0.5000 mL | PREFILLED_SYRINGE | Freq: Once | INTRAMUSCULAR | Status: DC

## 2021-02-07 MED ORDER — OXYCODONE-ACETAMINOPHEN 5-325 MG PO TABS: 1.0000 | ORAL_TABLET | ORAL | Status: DC | PRN

## 2021-02-07 MED ORDER — ONDANSETRON HCL 4 MG/2ML IJ SOLN
4.0000 mg | Freq: Four times a day (QID) | INTRAMUSCULAR | Status: DC | PRN
  Administered 2021-02-07: 4 mg via INTRAVENOUS
  Filled 2021-02-07: qty 2

## 2021-02-07 MED ORDER — OXYTOCIN BOLUS FROM INFUSION: 333.0000 mL | Freq: Once | INTRAVENOUS | Status: DC

## 2021-02-07 MED ORDER — PHENYLEPHRINE 40 MCG/ML (10ML) SYRINGE FOR IV PUSH (FOR BLOOD PRESSURE SUPPORT): 80.0000 ug | PREFILLED_SYRINGE | INTRAVENOUS | Status: DC | PRN

## 2021-02-07 MED ORDER — BENZOCAINE-MENTHOL 20-0.5 % EX AERO: 1.0000 "application " | INHALATION_SPRAY | CUTANEOUS | Status: DC | PRN

## 2021-02-07 MED ORDER — PRENATAL MULTIVITAMIN CH
1.0000 | ORAL_TABLET | Freq: Every day | ORAL | Status: DC
  Administered 2021-02-08: 12:00:00 1 via ORAL
  Filled 2021-02-07: qty 1

## 2021-02-07 MED ORDER — SENNOSIDES-DOCUSATE SODIUM 8.6-50 MG PO TABS
2.0000 | ORAL_TABLET | Freq: Every day | ORAL | Status: DC
  Administered 2021-02-08: 09:00:00 2 via ORAL
  Filled 2021-02-07: qty 2

## 2021-02-07 MED ORDER — IBUPROFEN 600 MG PO TABS
600.0000 mg | ORAL_TABLET | Freq: Four times a day (QID) | ORAL | Status: DC
  Administered 2021-02-07 – 2021-02-08 (×2): 600 mg via ORAL
  Filled 2021-02-07 (×2): qty 1

## 2021-02-07 MED ORDER — MEASLES, MUMPS & RUBELLA VAC IJ SOLR: 0.5000 mL | Freq: Once | INTRAMUSCULAR | Status: DC

## 2021-02-07 MED ORDER — LIDOCAINE-EPINEPHRINE (PF) 2 %-1:200000 IJ SOLN
INTRAMUSCULAR | Status: DC | PRN
  Administered 2021-02-07: 5 mL via EPIDURAL

## 2021-02-07 MED ORDER — SIMETHICONE 80 MG PO CHEW: 80.0000 mg | CHEWABLE_TABLET | ORAL | Status: DC | PRN

## 2021-02-07 MED ORDER — ONDANSETRON HCL 4 MG PO TABS: 4.0000 mg | ORAL_TABLET | ORAL | Status: DC | PRN

## 2021-02-07 MED ORDER — DIPHENHYDRAMINE HCL 25 MG PO CAPS: 25.0000 mg | ORAL_CAPSULE | Freq: Four times a day (QID) | ORAL | Status: DC | PRN

## 2021-02-07 MED ORDER — WITCH HAZEL-GLYCERIN EX PADS: 1.0000 "application " | MEDICATED_PAD | CUTANEOUS | Status: DC | PRN

## 2021-02-07 MED ORDER — LACTATED RINGERS IV SOLN: 500.0000 mL | INTRAVENOUS | Status: DC | PRN

## 2021-02-07 MED ORDER — TERBUTALINE SULFATE 1 MG/ML IJ SOLN: 0.2500 mg | Freq: Once | INTRAMUSCULAR | Status: DC | PRN

## 2021-02-07 MED ORDER — DIBUCAINE (PERIANAL) 1 % EX OINT: 1.0000 "application " | TOPICAL_OINTMENT | CUTANEOUS | Status: DC | PRN

## 2021-02-07 MED ORDER — COCONUT OIL OIL: 1.0000 "application " | TOPICAL_OIL | Status: DC | PRN

## 2021-02-07 MED ORDER — ONDANSETRON HCL 4 MG/2ML IJ SOLN: 4.0000 mg | INTRAMUSCULAR | Status: DC | PRN

## 2021-02-07 NOTE — Anesthesia Preprocedure Evaluation (Addendum)
Anesthesia Evaluation  Patient identified by MRN, date of birth, ID band Patient awake    Reviewed: Allergy & Precautions, NPO status , Patient's Chart, lab work & pertinent test results  Airway Mallampati: III  TM Distance: >3 FB Neck ROM: Full    Dental no notable dental hx. (+) Teeth Intact, Dental Advisory Given   Pulmonary neg pulmonary ROS,  Covid positive, no symptoms   Pulmonary exam normal breath sounds clear to auscultation       Cardiovascular hypertension (gHTN), Normal cardiovascular exam Rhythm:Regular Rate:Normal     Neuro/Psych  Headaches, PSYCHIATRIC DISORDERS Anxiety Depression    GI/Hepatic negative GI ROS, Neg liver ROS,   Endo/Other  Morbid obesity (BMI 42)  Renal/GU negative Renal ROS  negative genitourinary   Musculoskeletal negative musculoskeletal ROS (+)   Abdominal   Peds  Hematology negative hematology ROS (+)   Anesthesia Other Findings IOL for gHTN  Reproductive/Obstetrics (+) Pregnancy                            Anesthesia Physical Anesthesia Plan  ASA: 3  Anesthesia Plan: Epidural   Post-op Pain Management:    Induction:   PONV Risk Score and Plan: Treatment may vary due to age or medical condition  Airway Management Planned: Natural Airway  Additional Equipment:   Intra-op Plan:   Post-operative Plan:   Informed Consent: I have reviewed the patients History and Physical, chart, labs and discussed the procedure including the risks, benefits and alternatives for the proposed anesthesia with the patient or authorized representative who has indicated his/her understanding and acceptance.       Plan Discussed with: Anesthesiologist  Anesthesia Plan Comments: (Patient identified. Risks, benefits, options discussed with patient including but not limited to bleeding, infection, nerve damage, paralysis, failed block, incomplete pain control,  headache, blood pressure changes, nausea, vomiting, reactions to medication, itching, and post partum back pain. Confirmed with bedside nurse the patient's most recent platelet count. Confirmed with the patient that they are not taking any anticoagulation, have any bleeding history or any family history of bleeding disorders. Patient expressed understanding and wishes to proceed. All questions were answered. )        Anesthesia Quick Evaluation

## 2021-02-07 NOTE — Progress Notes (Signed)
Patient still holding in MAU. Resting comfortably.   Vitals with BMI 02/07/2021 02/07/2021 02/07/2021  Height - - -  Weight - - -  BMI - - -  Systolic 121 107 270  Diastolic 68 55 54  Pulse 92 86 83     Fetal Tracing:  Baseline: 130 Variability: moderate Accels: 15x15 Decels: none  Toco: none  Plan for IOL when patient able to come to labor and delivery.   Rolm Bookbinder, CNM 02/07/21 4:50 AM

## 2021-02-07 NOTE — Progress Notes (Signed)
Labor Progress Note Katherine Love is a 20 y.o. G2P0101 at [redacted]w[redacted]d presented for IOL for GHTN, COVID positive  S:  Patient resting comfortably in MAU. Unable to transfer to labor and delivery due to bed space.  O:  BP 133/79   Pulse 87   Temp 98.3 F (36.8 C) (Oral)   Resp 16   Wt 113.9 kg   LMP  (LMP Unknown)   SpO2 97%   BMI 41.80 kg/m   Fetal Tracing:  Baseline: 120 Variability: moderate Accels: 15x15 Decels: none  Toco: none   CVE: Dilation: 4 Effacement (%): 60 Exam by:: Donette Larry, CNM   A&P: 20 y.o. G2P0101 [redacted]w[redacted]d IOL gHTN  #Labor: Holding in MAU, patient comfortable.  #Pain: per patient request #FWB: Cat 1 #GBS negative  Rolm Bookbinder, CNM 12:08 AM

## 2021-02-07 NOTE — Plan of Care (Signed)
  Problem: Education: Goal: Knowledge of Childbirth will improve Outcome: Progressing Goal: Ability to make informed decisions regarding treatment and plan of care will improve Outcome: Progressing Goal: Ability to state and carry out methods to decrease the pain will improve Outcome: Progressing   

## 2021-02-07 NOTE — Discharge Summary (Signed)
Postpartum Discharge Summary   Patient Name: Katherine Love DOB: 05/21/2000 MRN: [id number]  Date of admission: 02/06/2021 Delivery date:02/07/2021  Delivering provider: Fatima Blank A  Date of discharge: 02/08/2021  Admitting diagnosis: Elevated blood pressure affecting pregnancy in third trimester, antepartum [O16.3] Intrauterine pregnancy: [redacted]w[redacted]d    Secondary diagnosis:  Principal Problem:   SVD (spontaneous vaginal delivery) Active Problems:   History of preterm delivery   Obesity affecting pregnancy   Elevated blood pressure affecting pregnancy in third trimester, antepartum   Limited prenatal care  Additional problems: None    Discharge diagnosis: Term Pregnancy Delivered                                              Post partum procedures: None Augmentation: AROM and Pitocin Complications: None  Hospital course: Induction of Labor With Vaginal Delivery   20y.o. yo [id number] [id number] admitted to the hospital 02/06/2021 for induction of labor.  Indication for induction: GHTN .  Patient had an uncomplicated labor course until delivery. At delivery, shoulder dystocia requiring 3 maneuvers.  See delivery summary.  Labor course as follows: Membrane Rupture Time/Date: 10:29 AM ,02/07/2021   Delivery Method:Vaginal, Spontaneous  Episiotomy: None  Lacerations:  None  Patient had a routine postpartum course. She is meeting all milestones. Her pain is controlled and her bleeding is appropriate. She is eating, drinking, voiding, and ambulating without issue. Patient is discharged home 02/08/21.  Newborn Data: Birth date:02/07/2021  Birth time:1:24 PM  Gender:Female  Living status:Living  Apgars:7 ,9  Weight:3420 g   Magnesium Sulfate received: No BMZ received: No Rhophylac: Yes - Rhogam given 02/08/21 MMR: No - Immune  T-DaP: No - Offered postpartum Flu: No Transfusion: No  Physical exam  Vitals:   02/07/21 2004 02/07/21 2351 02/08/21 0402 02/08/21 1550   BP: 124/74 122/71 121/79 129/74  Pulse: 80 70 73 81  Resp: 18 18 18 18   Temp: 98.2 F (36.8 C) 98.1 F (36.7 C) 98.1 F (36.7 C) 98.1 F (36.7 C)  TempSrc: Oral Oral Oral Oral  SpO2: 98% 99%  99%  Weight:      Height:       General: alert, cooperative, and no distress Lochia: appropriate Uterine Fundus: firm and below umbilicus  DVT Evaluation: no LE edema or calf tenderness to palpation   Labs: Lab Results  Component Value Date   WBC 9.7 02/06/2021   HGB 12.9 02/06/2021   HCT 37.3 02/06/2021   MCV 88.0 02/06/2021   PLT 308 02/06/2021   CMP Latest Ref Rng & Units 02/06/2021  Glucose 70 - 99 mg/dL 129(H)  BUN 6 - 20 mg/dL 8  Creatinine 0.44 - 1.00 mg/dL 0.58  Sodium 135 - 145 mmol/L 135  Potassium 3.5 - 5.1 mmol/L 3.6  Chloride 98 - 111 mmol/L 105  CO2 22 - 32 mmol/L 19(L)  Calcium 8.9 - 10.3 mg/dL 8.8(L)  Total Protein 6.5 - 8.1 g/dL 6.2(L)  Total Bilirubin 0.3 - 1.2 mg/dL 0.5  Alkaline Phos 38 - 126 U/L 122  AST 15 - 41 U/L 17  ALT 0 - 44 U/L 11   Edinburgh Score: Edinburgh Postnatal Depression Scale Screening Tool 02/07/2021  I have been able to laugh and see the funny side of things. 0  I have looked forward with enjoyment to things. 0  I  have blamed myself unnecessarily when things went wrong. 1  I have been anxious or worried for no good reason. 2  I have felt scared or panicky for no good reason. 2  Things have been getting on top of me. 1  I have been so unhappy that I have had difficulty sleeping. 0  I have felt sad or miserable. 1  I have been so unhappy that I have been crying. 1  The thought of harming myself has occurred to me. 1  Edinburgh Postnatal Depression Scale Total 9     After visit meds:  Allergies as of 02/08/2021   No Known Allergies      Medication List     STOP taking these medications    cyclobenzaprine 5 MG tablet Commonly known as: FLEXERIL       TAKE these medications    acetaminophen 500 MG tablet Commonly  known as: TYLENOL Take 2 tablets (1,000 mg total) by mouth every 8 (eight) hours as needed (pain).   ibuprofen 600 MG tablet Commonly known as: ADVIL Take 1 tablet (600 mg total) by mouth every 6 (six) hours as needed (pain).   multivitamin-prenatal 27-0.8 MG Tabs tablet Take 1 tablet by mouth daily at 12 noon.         Discharge home in stable condition Infant Feeding: Breast Infant Disposition: home with mother Discharge instruction: per After Visit Summary and Postpartum booklet. Activity: Advance as tolerated. Pelvic rest for 6 weeks.  Diet: routine diet  Follow up Visit: Message sent to [facility] on 02/07/21:  Please schedule this patient for a In person postpartum visit in 6 weeks with the following provider: Any provider. Additional Postpartum F/U: BP check 1 week  High risk pregnancy complicated by: HTN and limited prenatal care Delivery mode:  Vaginal, Spontaneous  Anticipated Birth Control:  Unsure; planning to decided at postpartum visit   02/08/2021 Genia Del, MD

## 2021-02-07 NOTE — Progress Notes (Signed)
Labor Progress Note Katherine Love is a 20 y.o. G2P0101 at [redacted]w[redacted]d presented for IOL for gHTN  S:  Patient resting, reports worsening contractions  O:  BP (!) 142/84   Pulse 83   Temp (!) 97 F (36.1 C) (Axillary)   Resp 17   Ht 5\' 5"  (1.651 m)   Wt 113.9 kg   LMP  (LMP Unknown)   SpO2 99%   BMI 41.80 kg/m   Fetal Tracing:  Baseline: 125 Variability: moderate Accels: 15x15 Decels: none  Toco: 10-15   CVE: Dilation: 4 Effacement (%): 60 Exam by:: [email or web address], CNM   A&P: 20 y.o. G2P0101 [redacted]w[redacted]d IOL gHTN #Labor: Discussed risks and benefits of pitocin augmentation for IOL. Patient agreeable to plan of care. Will start pitocin 2x2 and AROM with regular contractions #Pain: planning epidural #FWB: Cat 1 #GBS negative  [redacted]w[redacted]d, CNM 6:56 AM

## 2021-02-07 NOTE — Anesthesia Procedure Notes (Signed)
Epidural Patient location during procedure: OB Start time: 02/07/2021 11:20 AM End time: 02/07/2021 11:30 AM  Staffing Anesthesiologist: Elmer Picker, MD Performed: anesthesiologist   Preanesthetic Checklist Completed: patient identified, IV checked, risks and benefits discussed, monitors and equipment checked, pre-op evaluation and timeout performed  Epidural Patient position: sitting Prep: DuraPrep and site prepped and draped Patient monitoring: continuous pulse ox, blood pressure, heart rate and cardiac monitor Approach: midline Location: L3-L4 Injection technique: LOR air  Needle:  Needle type: Tuohy  Needle gauge: 17 G Needle length: 9 cm Needle insertion depth: 7 cm Catheter type: closed end flexible Catheter size: 19 Gauge Catheter at skin depth: 12 cm Test dose: negative  Assessment Sensory level: T8 Events: blood not aspirated, injection not painful, no injection resistance, no paresthesia and negative IV test  Additional Notes Patient identified. Risks/Benefits/Options discussed with patient including but not limited to bleeding, infection, nerve damage, paralysis, failed block, incomplete pain control, headache, blood pressure changes, nausea, vomiting, reactions to medication both or allergic, itching and postpartum back pain. Confirmed with bedside nurse the patient's most recent platelet count. Confirmed with patient that they are not currently taking any anticoagulation, have any bleeding history or any family history of bleeding disorders. Patient expressed understanding and wished to proceed. All questions were answered. Sterile technique was used throughout the entire procedure. Please see nursing notes for vital signs. Test dose was given through epidural catheter and negative prior to continuing to dose epidural or start infusion. Warning signs of high block given to the patient including shortness of breath, tingling/numbness in hands, complete motor  block, or any concerning symptoms with instructions to call for help. Patient was given instructions on fall risk and not to get out of bed. All questions and concerns addressed with instructions to call with any issues or inadequate analgesia.  Reason for block:procedure for pain

## 2021-02-07 NOTE — Progress Notes (Signed)
Katherine Love is a 20 y.o. G2P0101 at [redacted]w[redacted]d by early ultrasound admitted for IOL for GHTN  Subjective: Pt comfortable, feeling mild contractions. S/O in room for support.  Objective: BP 137/82   Pulse 90   Temp (!) 97.4 F (36.3 C) (Axillary)   Resp 16   Ht 5\' 5"  (1.651 m)   Wt 113.9 kg   LMP  (LMP Unknown)   SpO2 99%   BMI 41.80 kg/m  No intake/output data recorded. No intake/output data recorded.  FHT:  FHR: 130 bpm, variability: moderate,  accelerations:  Present,  decelerations:  Absent UC:   irregular, every 4-10 minutes SVE:   Dilation: 4.5 Effacement (%): 50 Station:-2 Exam by:: Leftwich-Kirby, CNM  AROM with clear fluid, IUPC placed without difficulty. Pt tolerated well.   Labs: Lab Results  Component Value Date   WBC 9.7 02/06/2021   HGB 12.9 02/06/2021   HCT 37.3 02/06/2021   MCV 88.0 02/06/2021   PLT 308 02/06/2021    Assessment / Plan: Induction of labor due to GHTN Pitocin S/P AROM/IUPC  Labor: Progressing normally Preeclampsia:  labs stable Fetal Wellbeing:  Category I Pain Control:  Labor support without medications I/D:   GBS neg Anticipated MOD:  NSVD  02/08/2021 02/07/2021, 12:17 PM

## 2021-02-07 NOTE — Plan of Care (Signed)
Claud Gowan, RN 

## 2021-02-08 DIAGNOSIS — O0930 Supervision of pregnancy with insufficient antenatal care, unspecified trimester: Secondary | ICD-10-CM

## 2021-02-08 MED ORDER — ACETAMINOPHEN 500 MG PO TABS: 1000.0000 mg | ORAL_TABLET | Freq: Three times a day (TID) | ORAL | 0 refills | Status: DC | PRN

## 2021-02-08 MED ORDER — RHO D IMMUNE GLOBULIN 1500 UNIT/2ML IJ SOSY
300.0000 ug | PREFILLED_SYRINGE | Freq: Once | INTRAMUSCULAR | Status: AC
Start: 2021-02-08 — End: 2021-02-08
  Administered 2021-02-08: 12:00:00 300 ug via INTRAVENOUS
  Filled 2021-02-08: qty 2

## 2021-02-08 MED ORDER — IBUPROFEN 600 MG PO TABS: 600.0000 mg | ORAL_TABLET | Freq: Four times a day (QID) | ORAL | 0 refills | Status: DC | PRN

## 2021-02-08 NOTE — Clinical Social Work Maternal (Addendum)
CLINICAL SOCIAL WORK MATERNAL/CHILD NOTE  Patient Details  Name: Katherine Love MRN: [id number] Date of Birth: 05-Aug-2000  Date:  02/08/2021  Clinical Social Worker Initiating Note:  [place], [place] Date/Time: Initiated:  02/08/21/1018     Child's Name:  Katherine Love   Biological Parents:  Mother, Father (MOB: Katherine Love 11[birth year]03[birth year]98, FOB: Katherine Love 03-28-1989)   Need for Interpreter:  None   Reason for Referral:  Other (Comment), Behavioral Health Concerns, Current Substance Use/Substance Use During Pregnancy   (Limited [facility])   Address:  [address] [place] [address]    Phone number:  [phone] (home)     Additional phone number:   Household Members/Support Persons (HM/SP):   Household Member/Support Person 1, Household Member/Support Person 2   HM/SP Name Relationship DOB or Age  HM/SP -1 Katherine Love Significant Other 03-28-1989  HM/SP -2 Maud Rubendall Son 11-07-2018  HM/SP -3        HM/SP -4        HM/SP -5        HM/SP -6        HM/SP -7        HM/SP -8          Natural Supports (not living in the home):      Professional Supports: None   Employment: Part-time   Type of Work: [employer]   Education:  High school graduate   Homebound arranged:    Surveyor, quantity Resources:  Medicaid   Other Resources:      Cultural/Religious Considerations Which May Impact Care:    Strengths:  Ability to meet basic needs  , Home prepared for child  , Pediatrician chosen   Psychotropic Medications:         Pediatrician:    Armed forces operational officer area  Pediatrician List:   [facility]  [place]    [facility]      Pediatrician Fax Number:    Risk Factors/Current Problems:  Substance Use     Cognitive State:  Able to Concentrate  , Insightful  , Alert  , Linear Thinking     Mood/Affect:  Calm     CSW Assessment:  CSW received  consult for hx of Anxiety, Depression, mental health disorder, 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 CSW offered  support and completed an assessment.    CSW spoke with MOB via phone to complete the assessment and offer support. CSW introduced CSW role and Congratulated MOB. CSW confirmed MOB using two identifiers. CSW informed MOB that CSW would share sensitive information and offered privacy. MOB reported the only person in the room at the time was FOB Katherine Love) and that she was fine with the sharing of all information with FOB present in the room. CSW proceeded. MOB confirmed demographic information on hospital file is correct. MOB reported that she, FOB, and her two-year-old son live together. MOB acknowledged FOB as her primary support. CSW inquired how MOB has felt since giving birth. MOB expressed feeling great and shared the L&D was rough because the infant got stuck but happy overall. CSW inquired how MOB felt during the pregnancy. MOB reported she had mixed emotions and was frustrated because she had a complicated pregnancy and was still caring for her two-year-old son. CSW inquired about MOB mental health history. MOB reported she was diagnosed with depression and anxiety at the age of  15-16. During the pregnancy she reported having a low mood some days, anxious and worried for no reason. MOB reported she has tried medication in the past however she felt it did not work. MOB reported seeing a therapist years ago which she felt was more helpful. MOB reported she does cope by painting, talking with FOB and journaling her thoughts. CSW encouraged MOB to continue using her coping skills and trying therapy again. CSW offered MOB mental health resources for follow up. MOB was receptive. MOB shared she experienced PPD after the birth of her son as evidenced by feeling sad most days and not wanting to get out of bed. MOB reported the symptoms eventually subsided after two months. CSW provided education  regarding the baby blues period vs. perinatal mood disorders. CSW recommended MOB complete a self-evaluation during the postpartum time period using the New Mom Checklist from Postpartum Progress and encouraged MOB to contact a medical professional if symptoms are noted at any time. MOB reported understanding. MOB denied SI/HI.   CSW inquired about MOB limited PNC. MOB disclosed she received limited PNC since she "never scheduled another appointment." CSW explained to MOB the hospital drug screen policy for limited [facility] CSW inquired if MOB used substances during the pregnancy. MOB disclosed "I smoked weed every now and then." MOB reported she used THC for nausea and the last time she used was last week. MOB denied using any other substance. MOB made aware that infant's UDS was positive for [facility] and that CSW will make a report to CPS. MOB made aware that CSW will monitor the infant's CDS. MOB reported understanding and had no questions. CSW offered MOB substance use resources. MOB declined recourses. MOB denied CPS history or involvement.   CSW provided review of Sudden Infant Death Syndrome (SIDS) precautions. MOB reported she has essential items for the infant including a bassinet where the infant will sleep. MOB had chosen [facility] for [facility] follow up and will have transportation to appointments. MOB expressed intertest in [facility] services. CSW provided [facility] resources. CSW assessed MOB for additional needs. MOB reported no additional needs.    CSW identifies no further need for intervention and no barriers to discharge at this time.    CSW Plan/Description:  Sudden Infant Death Syndrome (SIDS) Education, CSW Will Continue to Monitor Umbilical Cord Tissue Drug Screen Results and Make Report if [facility], Hospital Drug Screen Policy Information, Perinatal Mood and Anxiety Disorder (PMADs) Education, No Further Intervention Required/No Barriers to Discharge, Child Protective Service  Report      Clearance Coots, LCSW 02/08/2021, 12:11 PM

## 2021-02-08 NOTE — Anesthesia Postprocedure Evaluation (Signed)
Anesthesia Post Note  Patient: [name]  Procedure(s) Performed: AN AD HOC LABOR EPIDURAL     Patient location during evaluation: Mother Baby Anesthesia Type: Epidural Level of consciousness: awake and alert Pain management: pain level controlled Vital Signs Assessment: post-procedure vital signs reviewed and stable Respiratory status: spontaneous breathing Cardiovascular status: stable Postop Assessment: no headache, adequate PO intake, no backache, patient able to bend at knees, able to ambulate, epidural receding and no apparent nausea or vomiting Anesthetic complications: no   No notable events documented.  Last Vitals:  Vitals:   02/07/21 2351 02/08/21 0402  BP: 122/71 121/79  Pulse: 70 73  Resp: 18 18  Temp: 36.7 C 36.7 C  SpO2: 99%     Last Pain:  Vitals:   02/08/21 0905  TempSrc:   PainSc: 0-No pain   Pain Goal: Patients Stated Pain Goal: 0 (02/06/21 1454)              Epidural/Spinal Function Cutaneous sensation: Normal sensation (02/08/21 0905), Patient able to flex knees: Yes (02/08/21 0905), Patient able to lift hips off bed: Yes (02/08/21 0905), Back pain beyond tenderness at insertion site: No (02/08/21 0905), Progressively worsening motor and/or sensory loss: No (02/08/21 0905), Bowel and/or bladder incontinence post epidural: No (02/08/21 0905)  Katherine Love

## 2021-02-08 NOTE — Progress Notes (Signed)
Post Partum Day #1 Subjective: no complaints, up ad lib, and tolerating PO; breastfeeding going well; she desires a circumcision for her son- she was consented and a note placed on the baby's chart; she declines contraception for now; chart review shows that she has been normotensive overnight  Objective: Blood pressure 121/79, pulse 73, temperature 98.1 F (36.7 C), temperature source Oral, resp. rate 18, height 5\' 5"  (1.651 m), weight 113.9 kg, SpO2 99 %, unknown if currently breastfeeding.  Physical Exam:  General: alert, cooperative, and no distress Lochia: appropriate Uterine Fundus: firm DVT Evaluation: No evidence of DVT seen on physical exam.  Recent Labs    02/06/21 1554  HGB 12.9  HCT 37.3    Assessment/Plan: PPD#1 from vag del New onset dx gHTN- stable BPs without meds Very limited [facility]  Plan for discharge tomorrow, Social Work consult, and Circumcision prior to discharge   LOS: 2 days   [facility] CNM 02/08/2021, 8:56 AM

## 2021-02-09 LAB — RH IG WORKUP (INCLUDES ABO/RH)
Fetal Screen: NEGATIVE
Gestational Age(Wks): 39
Unit division: 0

## 2021-02-20 ENCOUNTER — Telehealth (HOSPITAL_COMMUNITY): Payer: Self-pay | Admitting: *Deleted

## 2021-02-20 NOTE — Telephone Encounter (Signed)
Call can't be completed, phone not answered.  Duffy Rhody, RN 02-20-2021 at 11:57am

## 2021-08-28 ENCOUNTER — Encounter: Payer: Self-pay | Admitting: *Deleted

## 2022-09-10 ENCOUNTER — Ambulatory Visit (INDEPENDENT_AMBULATORY_CARE_PROVIDER_SITE_OTHER): Payer: Medicaid Other | Admitting: Family Medicine

## 2022-09-10 VITALS — BP 118/68 | HR 89 | Ht 65.0 in | Wt 240.6 lb

## 2022-09-10 DIAGNOSIS — Z3200 Encounter for pregnancy test, result unknown: Secondary | ICD-10-CM | POA: Insufficient documentation

## 2022-09-10 LAB — POCT URINE PREGNANCY: Preg Test, Ur: POSITIVE — AB

## 2022-09-10 MED ORDER — PRENATAL 27-0.8 MG PO TABS: 1.0000 | ORAL_TABLET | Freq: Every day | ORAL | 1 refills | Status: AC

## 2022-09-10 NOTE — Assessment & Plan Note (Signed)
-  upreg positive, patient is happy with the result although this was unexpected. -advised on importance of prenatal vitamin daily, prescription sent to desired pharmacy -recommended against alcohol and tobacco use as she is already doing -patient opted to have initial OB labs today, pending -MAU precautions discussed -counseled on foods to avoid and importance of only cooked eggs and meats along with avoiding unpasteurized cheeses  -med rec reviewed and updated appropriately, not on any adverse medications for pregnancy  -scheduled with new PCP, Dr. Barbaraann Faster for initial OB visit on 7/1

## 2022-09-10 NOTE — Patient Instructions (Addendum)
It was great seeing you today!  Today we discussed your pregnancy, congratulations! I have prescribed a prenatal vitamin for you, please take this daily to prevent neural tube defects as we discussed. Please make sure to refrain from alcohol or tobacco use during this time.   Avoid uncooked eggs and cold, uncooked deli meats. Please also avoid unpasteurized cheeses.   We will get some initial prenatal blood work and you will have your initial prenatal visit with your new PCP, Dr. Barbaraann Faster.   Go to the MAU at [facility] at [facility] if: You have pain in your lower abdomen or pelvic area Your water breaks.  Sometimes it is a big gush of fluid, sometimes it is just a trickle that keeps getting your underwear wet or running down your legs You have vaginal bleeding.   Please follow up at your next scheduled appointment on 7/1 at 2:30 pm with Dr. Barbaraann Faster for your initial OB visit, if anything arises between now and then, please don't hesitate to contact our office.   Thank you for allowing [place] to be a part of your medical care!  Thank you, Dr. Robyne Peers

## 2022-09-10 NOTE — Progress Notes (Signed)
    SUBJECTIVE:   CHIEF COMPLAINT / HPI:   Patient presents for concern of possible pregnancy due to amenorrhea. LMP 07/24/2022. Had 2 negative pregnancies tests at the end of may and then a positive home pregnancy test on 6/9. Has 2 sons and felt similar to those pregnancies when she felt nauseous and vomited. Denies vaginal bleeding. Denies abdominal or pelvic pain. Denies any alcohol and tobacco use. Not currently taking prenatal vitamin.   OBJECTIVE:   BP 118/68   Pulse 89   Ht 5\' 5"  (1.651 m)   Wt 240 lb 9.6 oz (109.1 kg)   LMP 07/24/2022 (Approximate)   SpO2 98%   Breastfeeding No   BMI 40.04 kg/m   General: Patient well-appearing, in no acute distress. CV: RRR, no murmurs or gallops auscultated Resp: CTAB, no wheezing, rales or rhonchi noted Abdomen: soft, nontender, presence of bowel sounds Ext: no LE edema noted bilaterally Psych: mood appropriate, very pleasant throughout the encounter   ASSESSMENT/PLAN:   Possible pregnancy -upreg positive, patient is happy with the result although this was unexpected. -advised on importance of prenatal vitamin daily, prescription sent to desired pharmacy -recommended against alcohol and tobacco use as she is already doing -patient opted to have initial OB labs today, pending -MAU precautions discussed -counseled on foods to avoid and importance of only cooked eggs and meats along with avoiding unpasteurized cheeses  -med rec reviewed and updated appropriately, not on any adverse medications for pregnancy  -scheduled with new PCP, Dr. Barbaraann Faster for initial OB visit on 7/1   Katherine Leader, DO [facility]

## 2022-09-12 LAB — CBC/D/PLT+RPR+RH+ABO+RUBIGG...
Antibody Screen: NEGATIVE
Basophils Absolute: 0 10*3/uL (ref 0.0–0.2)
Basos: 0 %
Bilirubin, UA: NEGATIVE
EOS (ABSOLUTE): 0.1 10*3/uL (ref 0.0–0.4)
Eos: 1 %
Glucose, UA: NEGATIVE
HCV Ab: NONREACTIVE
HIV Screen 4th Generation wRfx: NONREACTIVE
Hematocrit: 44.6 % (ref 34.0–46.6)
Hemoglobin: 14.9 g/dL (ref 11.1–15.9)
Hepatitis B Surface Ag: NEGATIVE
Immature Grans (Abs): 0 10*3/uL (ref 0.0–0.1)
Immature Granulocytes: 0 %
Ketones, UA: NEGATIVE
Leukocytes,UA: NEGATIVE
Lymphocytes Absolute: 2.2 10*3/uL (ref 0.7–3.1)
Lymphs: 20 %
MCH: 30.2 pg (ref 26.6–33.0)
MCHC: 33.4 g/dL (ref 31.5–35.7)
MCV: 91 fL (ref 79–97)
Monocytes Absolute: 0.4 10*3/uL (ref 0.1–0.9)
Monocytes: 3 %
Neutrophils Absolute: 8.6 10*3/uL — ABNORMAL HIGH (ref 1.4–7.0)
Neutrophils: 76 %
Nitrite, UA: NEGATIVE
Platelets: 337 10*3/uL (ref 150–450)
RBC, UA: NEGATIVE
RBC: 4.93 x10E6/uL (ref 3.77–5.28)
RDW: 13 % (ref 11.7–15.4)
RPR Ser Ql: NONREACTIVE
Rh Factor: NEGATIVE
Rubella Antibodies, IGG: 5.39 index (ref >0.99)
Specific Gravity, UA: 1.02 (ref 1.005–1.030)
Urobilinogen, Ur: 0.2 mg/dL (ref 0.2–1.0)
WBC: 11.4 10*3/uL — ABNORMAL HIGH (ref 3.4–10.8)
pH, UA: 6.5 (ref 5.0–7.5)

## 2022-09-12 LAB — HGB FRACTIONATION CASCADE
Hgb A2: 3 % (ref 1.8–3.2)
Hgb A: 97 % (ref 96.4–98.8)
Hgb F: 0 % (ref 0.0–2.0)
Hgb S: 0 %

## 2022-09-12 LAB — MICROSCOPIC EXAMINATION: Casts: NONE SEEN /lpf

## 2022-09-12 LAB — URINE CULTURE, OB REFLEX

## 2022-09-12 LAB — HCV INTERPRETATION

## 2022-09-23 ENCOUNTER — Other Ambulatory Visit: Payer: Self-pay

## 2022-09-23 ENCOUNTER — Other Ambulatory Visit: Payer: Self-pay | Admitting: Family Medicine

## 2022-09-23 ENCOUNTER — Ambulatory Visit (INDEPENDENT_AMBULATORY_CARE_PROVIDER_SITE_OTHER): Payer: Medicaid Other | Admitting: Student

## 2022-09-23 ENCOUNTER — Encounter: Payer: Self-pay | Admitting: Student

## 2022-09-23 ENCOUNTER — Other Ambulatory Visit (HOSPITAL_COMMUNITY)
Admission: RE | Admit: 2022-09-23 | Discharge: 2022-09-23 | Disposition: A | Discharge status: Home / Self Care | Payer: Medicaid Other | Source: Ambulatory Visit | Attending: Family Medicine | Admitting: Family Medicine

## 2022-09-23 ENCOUNTER — Ambulatory Visit (HOSPITAL_COMMUNITY): Admission: RE | Admit: 2022-09-23 | Payer: Medicaid Other | Source: Ambulatory Visit

## 2022-09-23 ENCOUNTER — Ambulatory Visit (INDEPENDENT_AMBULATORY_CARE_PROVIDER_SITE_OTHER): Payer: Medicaid Other | Admitting: *Deleted

## 2022-09-23 VITALS — BP 122/65 | HR 85 | Ht 65.0 in | Wt 242.3 lb

## 2022-09-23 VITALS — BP 123/76 | HR 87 | Wt 242.3 lb

## 2022-09-23 DIAGNOSIS — Z3491 Encounter for supervision of normal pregnancy, unspecified, first trimester: Secondary | ICD-10-CM | POA: Insufficient documentation

## 2022-09-23 DIAGNOSIS — Z3A08 8 weeks gestation of pregnancy: Secondary | ICD-10-CM | POA: Insufficient documentation

## 2022-09-23 DIAGNOSIS — F32A Depression, unspecified: Secondary | ICD-10-CM

## 2022-09-23 DIAGNOSIS — Z6791 Unspecified blood type, Rh negative: Secondary | ICD-10-CM

## 2022-09-23 DIAGNOSIS — Z3A01 Less than 8 weeks gestation of pregnancy: Secondary | ICD-10-CM

## 2022-09-23 DIAGNOSIS — O209 Hemorrhage in early pregnancy, unspecified: Secondary | ICD-10-CM | POA: Diagnosis not present

## 2022-09-23 DIAGNOSIS — O26891 Other specified pregnancy related conditions, first trimester: Secondary | ICD-10-CM | POA: Diagnosis not present

## 2022-09-23 DIAGNOSIS — O26859 Spotting complicating pregnancy, unspecified trimester: Secondary | ICD-10-CM

## 2022-09-23 MED ORDER — RHOPHYLAC 1500 UNIT/2ML IJ SOSY: 300.0000 ug | PREFILLED_SYRINGE | Freq: Once | INTRAMUSCULAR | 0 refills | Status: DC

## 2022-09-23 MED ORDER — RHO D IMMUNE GLOBULIN 1500 UNIT/2ML IJ SOSY
300.0000 ug | PREFILLED_SYRINGE | Freq: Once | INTRAMUSCULAR | Status: AC
  Administered 2022-09-23: 300 ug via INTRAMUSCULAR

## 2022-09-23 MED ORDER — RHO D IMMUNE GLOBULIN 1500 UNITS IM SOSY: 300.0000 ug | PREFILLED_SYRINGE | Freq: Once | INTRAMUSCULAR | Status: DC

## 2022-09-23 NOTE — Patient Instructions (Addendum)
It was great to see you today! Thank you for choosing [facility] for your obstetric care. Katherine Love was seen for initial OB visit.   It was great to meet you today.   Spotting and cramping We talked with our OB colleagues, and we are concerned that you are having spotting and bleeding. This could be coming from a pregnancy that is outside of the uterus, or because you are Rho negative. 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 Your baby could be Rho positive. There is a chance that your body could be making antibodies against the red blood cells of your baby.  -We are recommending you [facility], to receive Rhogam. You will be given a medication called Rhogam. You have done nothing wrong, and there was nothing to be done to prevent this.   -Then go to [facility], Entrance A, and stop at the front desk and ask for [facility], tell them you are there for an OB ultrasound. They will direct you to where you need to go.  Plans: -Go to [facility] Address: [address], [place], [place], [address] Phone: [phone]  -[facility], Entrance A Address: [address] [place], [place], [place] [address] *Ultrasound located in [address] **Entrance A is located off of [place]     Next appointment Schedule for 4 weeks Will do genetic testing at that time, if you still would like.  Future Considerations: Schedule appointment with Dr. Levert Feinstein, to discuss breast feeding, when baby is here.  Depression:  Schedule appointment to discuss your depression/starting medication in the future. We will have to refer you to psychiatry for the genetic testing. (GeneSight)   MOMS TO BE  Activity: Moms-to-be must be very diligent about getting enough exercise, as staying active will not only help you keep your weight gains to a healthy level, but can also help you manage your  symptoms, and strengthen your body for labor.  Nutrition: Even though prenatal vitamins contain lots of vitamins and nutrients you need, it's important to reinforce this nutritional need through healthy eating. Pregnant women should do their best to eat plenty of fresh fruits, veggies, and meats, and avoid eating overly fatty or processed foods, as the body works best with only natural fuels.  Sleep: Getting the right amount of ZZZs is imperative during pregnancy, as it allows your body to recover to the strength it needs to help baby develop healthily, and can help you manage your toughest symptoms.  Blood pressure: Abnormal blood pressure, whether high or low, can both be dangerous for you and Baby during pregnancy, so it's very important to monitor your BP readings so you'll know that all is well.   Genetic Screening Options For you  NIPT stands for noninvasive prenatal testing. It's a screening test offered during pregnancy to see if the fetus is at risk for having a chromosomal disorder like Down syndrome (trisomy 72), trisomy 56 (Edwards syndrome) and trisomy 36 (Patau syndrome). The test can also determine the sex of the fetus.   Go to the MAU at [facility] if: You have pain in your lower abdomen or pelvic area Your water breaks.  Sometimes it is a big gush of fluid, sometimes it is just a trickle that keeps getting your underwear wet or running down your legs You have vaginal bleeding.  ORANGE CARD The "[employer]" guarantees healthcare access to people who meet eligibility requirements for services within [facility]. Your orange card gives you access to everything that the [facility] has to offer. When qualified, patients are assigned to a medical home to receive care management which includes referrals to specialty care.  Your orange card gives you access to everything that the [facility] has to offer.  All you need to do to take part in your health care is to give [facility] a call at [phone] or visit their website [email or web address]  ELIGIBILITY REQUIREMENTS If you answer Yes to ALL 4 below, then speak with a [facility] eligibility and enrollment person (at the [facility] and [employer] of Social Services) who will explain the program and determine if you qualify  1. You do not have a regular primary care doctor (Medical Home). 2. You are not eligible for state or federally sponsored health insurance including the [employer] Market[employer] (Exemption Required), Medicare, Medicaid (except a Forensic scientist) or Physicist, medical. 3. You live in [facility] (only). (min. 3 months, 6 months preferred) 4. Your annual income is between 0-200% of the federal poverty level  FINANCIAL ASSISTANCE Arts development officer Program (FAP ) The FAP program is for [address] and [place], [place] and [place] counties of [place] residents who are uninsured patients and have received hospital services with account balance(s) greater than or equal to $10,000. Qualified uninsured patients who apply to participate in the FAP will be reviewedby the [facility] Counseling Department. A revenue cycle representative will review the patient FAP application. If a patient fully cooperates with this process and no coverage is available, the account will be evaluated for financial assistance based on their income as compared to federal poverty guidelines (FPG). Patients with income less than or equal to 200% of FPG will receive a 100% discount. Patients between 201% and 400% of the FPG will qualify for partial discounts. Payment options are available to assist patients in paying their remaining balance.  Advertising account executive Program (FAS) The Advertising account executive program is for [place], [place] of [place], [place], [place]  and [place] counties of [place] residents who are uninsured patients and have received hospital services that resulted in a balance less than $10,000. Each account will be automatically reviewed for a financial assistance discount prior to billing. Eligibility is based on a financial assistance score from a third party vendor that indicates the likelihood a patient lives in poverty. Patients with qualifying accounts will be extended an adjustment to their account as part of the financial assistance program. Patients will be notified in writing of their eligibility status in the financial assistance program.  Patients can apply for the Financial Assistance Program by downloading an application at http://www.[place].com, requesting by mail, [facility] Patient Accounting, [address] [place], [place]. [address]), contacting customer service [phone]), or in person at any [facility] facility.  A copy of the [facility] Coverage Assistance and Financial Assistance policy is available upon request electronically and/or by mail.  In addition to the financial assistance programs, two discount programs are available to [facility] patients:  Uninsured Discount Uninsured patients will receive a discount off gross charges on all medically necessary services. This is applied automatically and no action is needed by the patient to receive this discount. The uninsured discount is available to all uninsured patients. Calculations are based on the amount generally billed (AGB) to  Medicare and Private Health Insurers.  Hardship Settlement This program is designed to assist [address], [place] of [place], [place], [place] and [place] counties of [place] residents who have had a catastrophic medical event regardless of their insurance coverage that has resulted in very large hospital bills in comparison to their financial resources. Patients who have incurred a balance of greater than $5,000  after all insurance or third party payments and the remaining balance is greater than 20% of their total household financial resources may be eligible for a [employer]. Patients seeking a hardship settlement discount should inquire about this program by calling the customer service department [phone]) after receiving their first statement.  Contact information: [facility] Patient Accounting [address] Patient Customer Service [place], [place]. [address] [phone] Attn: Customer Service  If you haven't already, sign up for My Chart to have easy access to your labs results, and communication with your primary care physician.  We are checking some labs today. If they are abnormal, I will call you. If they are normal, I will send you a MyChart message (if it is active) or a letter in the mail. If you do not hear about your labs in the next 2 weeks, please call the office.   You should return to our clinic in 4 weeks  I recommend that you always bring your medications to each appointment as this makes it easy to ensure you are on the correct medications and helps [place] not miss refills when you need them.  Please arrive 15 minutes before your appointment to ensure smooth check in process.  We appreciate your efforts in making this happen.  Please call the clinic at [phone] if your symptoms worsen or you have any concerns.  Thank you for allowing me to participate in your care,

## 2022-09-23 NOTE — Assessment & Plan Note (Signed)
Patient comes in for her initial OB visit.  Patient noting cramping and vaginal bleeding for last 3 weeks.  Given concern for possible ectopic pregnancy/alloimmunization in pregnancy patient was sent to receive RhoGAM, and to receive OB ultrasound.  Patient unsure of last menstrual period / notes irregular cycles, making date of last menstrual period 4 resource for OB dating.  Plan to date pregnancy based off OB ultrasound. Patient planning for genetic testing at next visit.

## 2022-09-23 NOTE — Assessment & Plan Note (Addendum)
Patient notes a history of depression since she was a teenager.  Patient in the past has tried multiple medications without success.  She is resigned to tough it out/just deal with the symptoms.  Discussed possibility for GeneSight testing, to determine the best antidepressant.  Patient interested in this option.  Told patient we would likely have to refer her to psychiatry for this testing.  Patient open to this.  Patient will follow-up in future.  PHQ-9 score to 7 today, with no concerns for suicidal ideation, scored 4 increased appetite/overeating and decreased energy levels. - Supply outpatient psychiatry resources at next visit

## 2022-09-23 NOTE — Assessment & Plan Note (Signed)
Patient notes roughly 3 weeks of cramping and vaginal spotting.  Patient has irregular periods and believes she is around [redacted] weeks pregnant given her last menstrual period was early May.  Discussed bleeding with attending, Dr. Pollie Meyer, who notes that patient is from negative, and on her third pregnancy.  There is concern for maternal antibodies attacking fetal blood cells, also concern for ectopic pregnancy. Consulted OB/GYN faculty who recommended RhoGAM today and OB US.  [facility] of Rho[facility]AM.  Patient was also to return to [facility] for OB ultrasound.  Ultrasound to confirm uterine pregnancy and gestational age. - RhoGAM - OB ultrasound less than 14 weeks

## 2022-09-23 NOTE — Progress Notes (Signed)
BP recheck 123/76

## 2022-09-23 NOTE — Progress Notes (Addendum)
Patient Name: Katherine Love Date of Birth: Nov 16, 2000 [facility] Initial Prenatal Visit  Katherine Love is a 22 y.o. year old G2P1102 at Unknown who presents for her initial prenatal visit. Pregnancy is not planned, and desired.  She reports  a little spotting and cramping for the last 3 weeks, symptoms nearly daily, but have subsided last 3 days.   . She is taking a prenatal vitamin.  She has pelvic pain or vaginal bleeding.   Pregnancy Dating: The patient is dated by LMP LMP: 07/24/22, irregular Period is certain:  Yes.  Periods were regular:  No.  LMP was a typical period:  No.  Using hormonal contraception in 3 months prior to conception: Yes  Lab Review: Blood type: O Rh Status: - Antibody screen: Negative HIV: Negative RPR: Negative Hemoglobin electrophoresis reviewed: Yes Results of OB urine culture are: Negative Rubella: Immune Hep C Ab: Negative Varicella status is Unknown  PMH: Reviewed and as detailed below: HTN: No  Gestational Hypertension/preeclampsia: No  Type 1 or 2 Diabetes: No  Depression:  Yes  Seizure disorder:  No VTE: No ,  History of STI Yes,  Abnormal Pap smear:  No, Genital herpes simplex:  No   PSH: Gynecologic Surgery:  no Surgical history reviewed, notable for: Nothing  Obstetric History: Obstetric history tab updated and reviewed.  Summary of prior pregnancies: updated Cesarean delivery: No  Gestational Diabetes:  No Hypertension in pregnancy: No Hx of PreE in family  History of preterm birth: Yes History of LGA/SGA infant:  No History of shoulder dystocia: Yes Indications for referral were reviewed, and the patient has no obstetric indications for referral to High Risk OB Clinic at this time.   Social History: Partner's name: Ethelene Browns  Tobacco use: No Alcohol use:  No Other substance use:  No  Current Medications:  Ollie PNV  Reviewed and appropriate in pregnancy.   Genetic and Infection Screen: Flow  Sheet Updated Yes  Prenatal Exam: Gen: Well nourished, well developed.  No distress.  Vitals noted. HEENT: Normocephalic, atraumatic.  Neck supple without cervical lymphadenopathy, thyromegaly or thyroid nodules.  Fair dentition. CV: RRR no murmur, gallops or rubs Lungs: CTA B.  Normal respiratory effort without wheezes or rales. Abd: soft, NTND. +BS.  Uterus not appreciated above pelvis. GU: Normal external female genitalia without lesions.  Nl vaginal, well rugated without lesions. No vaginal discharge.  Bimanual exam: No adnexal mass or TTP. No CMT.   Ext: No clubbing, cyanosis or edema. Psych: Normal grooming and dress.  Not depressed or anxious appearing.  Normal thought content and process without flight of ideas or looseness of associations   Assessment/Plan:  Katherine Love is a 22 y.o. G2P1102 at ~ 8 wks who presents to initiate prenatal care. She is doing well.  Current pregnancy issues include spotting and cramping.  Routine prenatal care: As dating is not reliable, a dating ultrasound has been ordered. Dating tab updated. As patient having cramping and spotting ultrasound was ordered to confirm intrauterine pregnancy. Pre-pregnancy weight updated. Expected weight gain this pregnancy is 15-25 pounds Prenatal labs reviewed, notable for elevated WBC, no other signs of infection. Indications for referral to HROB were reviewed and the patient does not meet criteria for referral.  Medication list reviewed and updated.  Recommended patient see a dentist for regular care.  Bleeding and pain precautions reviewed. Importance of prenatal vitamins reviewed.  Genetic screening offered. Patient opted for: first trimester screen with nuchal translucency at 11-13  weeks. The patient has the following indications for aspirinto begin 81 mg at 12-16 weeks: One high risk condition: no single high risk condition  MORE than one moderate risk condition: obesity and none Aspirin was not   recommended today based upon above risk factors (one high risk condition or more than one moderate risk factor)  The patient will not be age 39 or over at time of delivery. Referral to genetic counseling was not offered today.  The patient has the following risk factors for preexisting diabetes: BMI > 25 and sedentary lifestyle. An early 1 hour glucose tolerance test was not ordered. Pregnancy Medical Home and PHQ-9 forms completed, problems noted: Yes  2. Pregnancy issues include the following which were addressed today:  Risk of Alloimmunization given bleeding and cramping. Patient sent to [facility] to receive Rhogam Concern for ectopic pregnancy, given bleeding and cramping. Patient sent for OB ultrasound   Follow up 4 weeks for next prenatal visit.

## 2022-09-23 NOTE — Progress Notes (Signed)
Pt sent from [facility] office for injection only. Rhophylac IM administered as scheduled without difficulty.  Pt instructed to keep next prenatal visit appt as scheduled. She voiced understanding and had no questions.

## 2022-09-24 ENCOUNTER — Telehealth: Payer: Self-pay | Admitting: Student

## 2022-09-24 NOTE — Telephone Encounter (Signed)
Called to inquire how patients appointments, after our initial OB visit, went. Patient was to go to [facility] for Rhogam, (for risk of alloimmunization reaction to baby) and return to [facility], for OB ultrasound,( to confirm intra-uterine pregnancy/rule out ectopic pregnancy). Patient was experiencing bleeding and cramping, and is Rho negative, concerning for possible alloimmunization reaction, and also concerning for possible ectopic pregnancy.   If patient calls back, she can schedule an appointment for OB ultrasound at the number [phone]. Patient should do so emergently, as ectopic pregnancy would be surgical emergency. If patient unable to get one done emergently, patient should let provider know, and we will coordinate sending her to [facility] / MAU.

## 2022-09-25 ENCOUNTER — Inpatient Hospital Stay (HOSPITAL_COMMUNITY)
Admission: RE | Admit: 2022-09-25 | Discharge: 2022-09-25 | Disposition: A | Discharge status: Home / Self Care | Payer: Medicaid Other | Source: Ambulatory Visit | Attending: Family Medicine | Admitting: Family Medicine

## 2022-09-25 ENCOUNTER — Other Ambulatory Visit: Payer: Self-pay | Admitting: Family Medicine

## 2022-09-25 DIAGNOSIS — Z3687 Encounter for antenatal screening for uncertain dates: Secondary | ICD-10-CM | POA: Diagnosis not present

## 2022-09-25 DIAGNOSIS — O209 Hemorrhage in early pregnancy, unspecified: Secondary | ICD-10-CM

## 2022-09-25 DIAGNOSIS — Z3A08 8 weeks gestation of pregnancy: Secondary | ICD-10-CM | POA: Diagnosis present

## 2022-09-25 DIAGNOSIS — F32A Depression, unspecified: Secondary | ICD-10-CM

## 2022-09-25 LAB — CERVICOVAGINAL ANCILLARY ONLY
Chlamydia: NEGATIVE
Comment: NEGATIVE
Comment: NEGATIVE
Comment: NORMAL
Neisseria Gonorrhea: NEGATIVE
Trichomonas: NEGATIVE

## 2022-09-25 NOTE — Telephone Encounter (Signed)
Patient returns call to nurse line.  Patient reports she received Rhogam, however by the time she reported to [place] apt she was too late.   Patient reports she was experiencing "a lot" of pressure last night. She reports she has not had any bleeding for ~3-4 days. She reports her abdominal cramps have subsided.   I called scheduling to ensure she was able to get an apt as soon as possible.   Patient has been scheduled for today at 3pm at [facility].   Patient aware.   MAU precautions discussed in the meantime.

## 2022-09-27 ENCOUNTER — Encounter: Payer: Self-pay | Admitting: Family Medicine

## 2022-09-27 DIAGNOSIS — O26899 Other specified pregnancy related conditions, unspecified trimester: Secondary | ICD-10-CM | POA: Insufficient documentation

## 2022-09-27 DIAGNOSIS — Z6791 Unspecified blood type, Rh negative: Secondary | ICD-10-CM | POA: Insufficient documentation

## 2022-09-30 ENCOUNTER — Telehealth: Payer: Self-pay | Admitting: Student

## 2022-09-30 NOTE — Telephone Encounter (Signed)
Called to inform patient about results from previous visit, and ultrasound.  Patient's STI testing from initial OB visit were all negative.  Patient's ultrasound to confirm intrauterine pregnancy, and dating of pregnancy came back normal, with intrauterine pregnancy, and gestational age roughly 8 weeks.  This means baby is in the uterus, like normal, and roughly the age of where we had estimated, based off her last menstrual period.  Patient is consistent with what they thought further last menstrual period.  Patient to message provider with any questions.

## 2022-10-14 NOTE — Telephone Encounter (Cosign Needed)
error 

## 2022-10-16 ENCOUNTER — Encounter: Payer: Self-pay | Admitting: Student

## 2022-10-23 ENCOUNTER — Encounter: Payer: Self-pay | Admitting: Student

## 2022-10-26 ENCOUNTER — Inpatient Hospital Stay (HOSPITAL_COMMUNITY)
Admission: AD | Admit: 2022-10-26 | Discharge: 2022-10-26 | Disposition: A | Discharge status: Home / Self Care | Payer: Medicaid Other | Attending: Obstetrics & Gynecology | Admitting: Obstetrics & Gynecology

## 2022-10-26 DIAGNOSIS — M545 Low back pain, unspecified: Secondary | ICD-10-CM

## 2022-10-26 DIAGNOSIS — O2341 Unspecified infection of urinary tract in pregnancy, first trimester: Secondary | ICD-10-CM | POA: Insufficient documentation

## 2022-10-26 DIAGNOSIS — O26891 Other specified pregnancy related conditions, first trimester: Secondary | ICD-10-CM | POA: Diagnosis present

## 2022-10-26 DIAGNOSIS — O2342 Unspecified infection of urinary tract in pregnancy, second trimester: Secondary | ICD-10-CM | POA: Diagnosis not present

## 2022-10-26 DIAGNOSIS — Z3A12 12 weeks gestation of pregnancy: Secondary | ICD-10-CM | POA: Diagnosis not present

## 2022-10-26 DIAGNOSIS — M5459 Other low back pain: Secondary | ICD-10-CM | POA: Insufficient documentation

## 2022-10-26 DIAGNOSIS — O26892 Other specified pregnancy related conditions, second trimester: Secondary | ICD-10-CM | POA: Diagnosis not present

## 2022-10-26 DIAGNOSIS — N390 Urinary tract infection, site not specified: Secondary | ICD-10-CM | POA: Diagnosis not present

## 2022-10-26 DIAGNOSIS — O219 Vomiting of pregnancy, unspecified: Secondary | ICD-10-CM | POA: Insufficient documentation

## 2022-10-26 DIAGNOSIS — R300 Dysuria: Secondary | ICD-10-CM | POA: Diagnosis present

## 2022-10-26 LAB — CBC WITH DIFFERENTIAL/PLATELET
Abs Immature Granulocytes: 0.08 10*3/uL — ABNORMAL HIGH (ref 0.00–0.07)
Basophils Absolute: 0 10*3/uL (ref 0.0–0.1)
Basophils Relative: 0 %
Eosinophils Absolute: 0 10*3/uL (ref 0.0–0.5)
Eosinophils Relative: 0 %
HCT: 39.5 % (ref 36.0–46.0)
Hemoglobin: 13.7 g/dL (ref 12.0–15.0)
Immature Granulocytes: 1 %
Lymphocytes Relative: 10 %
Lymphs Abs: 1.7 10*3/uL (ref 0.7–4.0)
MCH: 30.6 pg (ref 26.0–34.0)
MCHC: 34.7 g/dL (ref 30.0–36.0)
MCV: 88.2 fL (ref 80.0–100.0)
Monocytes Absolute: 0.6 10*3/uL (ref 0.1–1.0)
Monocytes Relative: 4 %
Neutro Abs: 14.1 10*3/uL — ABNORMAL HIGH (ref 1.7–7.7)
Neutrophils Relative %: 85 %
Platelets: 316 10*3/uL (ref 150–400)
RBC: 4.48 MIL/uL (ref 3.87–5.11)
RDW: 12.6 % (ref 11.5–15.5)
WBC: 16.5 10*3/uL — ABNORMAL HIGH (ref 4.0–10.5)
nRBC: 0 % (ref 0.0–0.2)

## 2022-10-26 LAB — URINALYSIS, ROUTINE W REFLEX MICROSCOPIC
Bilirubin Urine: NEGATIVE
Glucose, UA: NEGATIVE mg/dL
Ketones, ur: 20 mg/dL — AB
Nitrite: POSITIVE — AB
Protein, ur: >=300 mg/dL — AB
Specific Gravity, Urine: 1.014 (ref 1.005–1.030)
WBC, UA: >50 WBC/hpf (ref 0–5)
pH: 6 (ref 5.0–8.0)

## 2022-10-26 LAB — COMPREHENSIVE METABOLIC PANEL
ALT: 15 U/L (ref 0–44)
AST: 20 U/L (ref 15–41)
Albumin: 3.6 g/dL (ref 3.5–5.0)
Alkaline Phosphatase: 80 U/L (ref 38–126)
Anion gap: 15 (ref 5–15)
BUN: 7 mg/dL (ref 6–20)
CO2: 18 mmol/L — ABNORMAL LOW (ref 22–32)
Calcium: 9.1 mg/dL (ref 8.9–10.3)
Chloride: 102 mmol/L (ref 98–111)
Creatinine, Ser: 0.53 mg/dL (ref 0.44–1.00)
GFR, Estimated: >60 mL/min (ref >60)
Glucose, Bld: 90 mg/dL (ref 70–99)
Potassium: 3.6 mmol/L (ref 3.5–5.1)
Sodium: 135 mmol/L (ref 135–145)
Total Bilirubin: 0.6 mg/dL (ref 0.3–1.2)
Total Protein: 7 g/dL (ref 6.5–8.1)

## 2022-10-26 MED ORDER — KETOROLAC TROMETHAMINE 30 MG/ML IJ SOLN
30.0000 mg | Freq: Once | INTRAMUSCULAR | Status: AC
  Administered 2022-10-26: 30 mg via INTRAVENOUS
  Filled 2022-10-26: qty 1

## 2022-10-26 MED ORDER — LACTATED RINGERS IV BOLUS
1000.0000 mL | Freq: Once | INTRAVENOUS | Status: AC
  Administered 2022-10-26: 1000 mL via INTRAVENOUS

## 2022-10-26 MED ORDER — IBUPROFEN 600 MG PO TABS: 600.0000 mg | ORAL_TABLET | Freq: Once | ORAL | Status: DC

## 2022-10-26 MED ORDER — PHENAZOPYRIDINE HCL 200 MG PO TABS: 200.0000 mg | ORAL_TABLET | Freq: Three times a day (TID) | ORAL | 0 refills | Status: DC | PRN

## 2022-10-26 MED ORDER — CEFADROXIL 500 MG PO CAPS: 500.0000 mg | ORAL_CAPSULE | Freq: Two times a day (BID) | ORAL | 0 refills | Status: AC

## 2022-10-26 MED ORDER — SODIUM CHLORIDE 0.9 % IV SOLN
1.0000 g | Freq: Once | INTRAVENOUS | Status: AC
  Administered 2022-10-26: 1 g via INTRAVENOUS
  Filled 2022-10-26: qty 10

## 2022-10-26 MED ORDER — SODIUM CHLORIDE 0.9 % IV SOLN
8.0000 mg | Freq: Once | INTRAVENOUS | Status: AC
  Administered 2022-10-26: 8 mg via INTRAVENOUS
  Filled 2022-10-26: qty 4

## 2022-10-26 MED ORDER — ONDANSETRON HCL 4 MG PO TABS: 4.0000 mg | ORAL_TABLET | Freq: Every day | ORAL | 1 refills | Status: DC | PRN

## 2022-10-26 NOTE — MAU Provider Note (Signed)
History     CSN: [id number]  Arrival date and time: 10/26/22 4098   Event Date/Time   First Provider Initiated Contact with Patient 10/26/22 1124      Chief Complaint  Patient presents with   Abdominal Pain   Back Pain   HPI  Katherine Love is a 22 y.o. female [phone] @ [redacted]w[redacted]d here in MAU with lower back pain and Dysuria. Symptoms started 2-3 days ago. At that time she started drinking cranberry juice and it got better. Last night she started having lower back pain and lower abdominal pain. She has been vomiting every morning and has vomited today  No fever.   OB History     Gravida  3   Para  2   Term  1   Preterm  1   AB  0   Living  2      SAB  0   IAB  0   Ectopic  0   Multiple  0   Live Births  2           Past Medical History:  Diagnosis Date   Anxiety    Depression    Headache     Past Surgical History:  Procedure Laterality Date   NO PAST SURGERIES      Family History  Problem Relation Age of Onset   Hypertension Mother     Social History   Tobacco Use   Smoking status: Never   Smokeless tobacco: Never  Vaping Use   Vaping status: Never Used  Substance Use Topics   Alcohol use: Never   Drug use: Yes    Frequency: 1.0 times per week    Types: Marijuana    Allergies: No Known Allergies  Facility-Administered Medications Prior to Admission  Medication Dose Route Frequency Provider Last Rate Last Admin   rho (d) immune globulin (HYPERRHO/RHOGAM) injection 300 mcg  300 mcg Intramuscular Once Latrelle Dodrill, MD       Medications Prior to Admission  Medication Sig Dispense Refill Last Dose   Prenatal Vit-Fe Fumarate-FA (MULTIVITAMIN-PRENATAL) 27-0.8 MG TABS tablet Take 1 tablet by mouth daily at 12 noon. 90 tablet 1    Results for orders placed or performed during the hospital encounter of 10/26/22 (from the past 48 hour(s))  Urinalysis, Routine w reflex microscopic -Urine, Clean Catch     Status: Abnormal    Collection Time: 10/26/22  9:40 AM  Result Value Ref Range   Color, Urine YELLOW YELLOW   APPearance CLOUDY (A) CLEAR   Specific Gravity, Urine 1.014 1.005 - 1.030   pH 6.0 5.0 - 8.0   Glucose, UA NEGATIVE NEGATIVE mg/dL   Hgb urine dipstick MODERATE (A) NEGATIVE   Bilirubin Urine NEGATIVE NEGATIVE   Ketones, ur 20 (A) NEGATIVE mg/dL   Protein, ur >=295 (A) NEGATIVE mg/dL   Nitrite POSITIVE (A) NEGATIVE   Leukocytes,Ua LARGE (A) NEGATIVE   RBC / HPF 21-50 0 - 5 RBC/hpf   WBC, UA >50 0 - 5 WBC/hpf   Bacteria, UA FEW (A) NONE SEEN   Squamous Epithelial / HPF 0-5 0 - 5 /HPF   Mucus PRESENT     Comment: Performed at [facility], [address], [place], [place] [address]  CBC with Differential/Platelet     Status: Abnormal   Collection Time: 10/26/22 12:02 PM  Result Value Ref Range   WBC 16.5 (H) 4.0 - 10.5 K/uL   RBC 4.48 3.87 - 5.11 MIL/uL  Hemoglobin 13.7 12.0 - 15.0 g/dL   HCT 09.8 11.9 - 14.7 %   MCV 88.2 80.0 - 100.0 fL   MCH 30.6 26.0 - 34.0 pg   MCHC 34.7 30.0 - 36.0 g/dL   RDW 82.9 56.2 - 13.0 %   Platelets 316 150 - 400 K/uL   nRBC 0.0 0.0 - 0.2 %   Neutrophils Relative % 85 %   Neutro Abs 14.1 (H) 1.7 - 7.7 K/uL   Lymphocytes Relative 10 %   Lymphs Abs 1.7 0.7 - 4.0 K/uL   Monocytes Relative 4 %   Monocytes Absolute 0.6 0.1 - 1.0 K/uL   Eosinophils Relative 0 %   Eosinophils Absolute 0.0 0.0 - 0.5 K/uL   Basophils Relative 0 %   Basophils Absolute 0.0 0.0 - 0.1 K/uL   Immature Granulocytes 1 %   Abs Immature Granulocytes 0.08 (H) 0.00 - 0.07 K/uL    Comment: Performed at [facility], [address] [place], [place] [address]  Comprehensive metabolic panel     Status: Abnormal   Collection Time: 10/26/22 12:02 PM  Result Value Ref Range   Sodium 135 135 - 145 mmol/L   Potassium 3.6 3.5 - 5.1 mmol/L   Chloride 102 98 - 111 mmol/L   CO2 18 (L) 22 - 32 mmol/L   Glucose, Bld 90 70 - 99 mg/dL    Comment: Glucose reference range applies only to  samples taken after fasting for at least 8 hours.   BUN 7 6 - 20 mg/dL   Creatinine, Ser 4.69 0.44 - 1.00 mg/dL   Calcium 9.1 8.9 - 62.9 mg/dL   Total Protein 7.0 6.5 - 8.1 g/dL   Albumin 3.6 3.5 - 5.0 g/dL   AST 20 15 - 41 U/L   ALT 15 0 - 44 U/L   Alkaline Phosphatase 80 38 - 126 U/L   Total Bilirubin 0.6 0.3 - 1.2 mg/dL   GFR, Estimated >52 >84 mL/min    Comment: (NOTE) Calculated using the CKD-EPI Creatinine Equation (2021)    Anion gap 15 5 - 15    Comment: Performed at [facility], [address] [place], [place] [address]    Review of Systems  Constitutional:  Negative for fever.  Genitourinary:  Positive for dysuria and frequency. Negative for flank pain.  Musculoskeletal:  Positive for back pain.   Physical Exam   Blood pressure 133/74, pulse (!) 112, temperature 98.5 F (36.9 C), temperature source Oral, resp. rate 18, height 5\' 5"  (1.651 m), weight 110.1 kg, last menstrual period 07/24/2022, SpO2 100%, not currently breastfeeding.  Physical Exam Constitutional:      General: She is not in acute distress.    Appearance: She is well-developed. She is not ill-appearing, toxic-appearing or diaphoretic.  HENT:     Head: Normocephalic.  Abdominal:     Tenderness: There is no right CVA tenderness or left CVA tenderness.  Musculoskeletal:     Lumbar back: Normal.  Skin:    General: Skin is warm.  Neurological:     Mental Status: She is alert and oriented to person, place, and time.    MAU Course  Procedures  MDM  Ibuprofen 600 PO Zofran 8 mg IV Rocephin 1 gram given IV LR bolus x 1 Urine culture pending  Patient reports 0/10 pain at the time of discharge. Feels much better.   Assessment and Plan   A:  1. UTI in pregnancy, antepartum, second trimester  2. [redacted] weeks gestation of pregnancy   3. Acute bilateral low back pain, unspecified whether sciatica present     P:   Discharge home  Rx: Duricef, Zofran Increase oral fluid intake  Strict  return precautions Pyelo precautions.   Duane Lope, NP 10/26/2022 4:57 PM

## 2022-10-26 NOTE — MAU Note (Signed)
...  Katherine Love is a 22 y.o. at [redacted]w[redacted]d here in MAU reporting: Constant lower back and lower abdominal pain. She reports the pains are constant but reports there are moments where the pain intensifies. She reports the back pain is in the middle at the bottom of her back. Denies VB. Reports white vaginal discharge. Denies vaginal itching and vaginal odors.  -Has not taken anything for the pain. Reports she tried heating pad, warm baths, and cold compresses with no relief. -Next OB appointment on 8/6.  Onset of complaint: Yesterday at 2100  Pain score:  8/10 lower back 7/10 lower abdomen  FHT: 164 doppler Lab orders placed from triage: UA

## 2022-10-28 LAB — CULTURE, OB URINE: Culture: >=100000 — AB

## 2022-10-29 ENCOUNTER — Encounter: Payer: Self-pay | Admitting: Student

## 2022-10-29 ENCOUNTER — Ambulatory Visit (INDEPENDENT_AMBULATORY_CARE_PROVIDER_SITE_OTHER): Payer: Medicaid Other | Admitting: Student

## 2022-10-29 VITALS — BP 124/79 | HR 100 | Wt 242.6 lb

## 2022-10-29 DIAGNOSIS — Z3A12 12 weeks gestation of pregnancy: Secondary | ICD-10-CM

## 2022-10-29 NOTE — Patient Instructions (Addendum)
It was great to see you today!    Future Appointments  Date Time Provider Department Center  11/27/2022 10:30 AM Bess Kinds, MD [facility]    Please arrive 15 minutes before your appointment to ensure smooth check in process.    Please call the clinic at [phone] if your symptoms worsen or you have any concerns.  Thank you for allowing me to participate in your care, Dr. Glendale Chard [facility]

## 2022-10-29 NOTE — Progress Notes (Signed)
  Patient Name: Katherine Love Date of Birth: Aug 03, 2000 [facility] Prenatal Visit  DEOVION TROSS is a 22 y.o. [phone] at [redacted]w[redacted]d here for routine follow up. She is dated by LMP and confirmed with U/S.  She reports no complaints.  She denies vaginal bleeding.  See flow sheet for details.  Vitals:   10/29/22 1142  BP: 124/79  Pulse: 100     A/P: Pregnancy at [redacted]w[redacted]d.  Doing well.    Routine Prenatal Care:  Dating reviewed, dating tab is correct Fetal heart tones Appropriate Influenza vaccine not administered as not influenza season.   COVID vaccination was discussed and offered .  The patient has the following indication for screening preexisting diabetes: BMI > 25 and sedentary lifestyle. Anatomy ultrasound ordered to be scheduled at 18-20 weeks. Patient is not interested in genetic screening. As she is past 13 weeks and 6 days, a Quad screen  was offered.  Pregnancy education including expected weight gain in pregnancy, OTC medication use, continued use of prenatal vitamin, smoking cessation if applicable, and nutrition in pregnancy.   Bleeding and pain precautions reviewed. The patient has the following indications for aspirin to begin 81 mg at 12-16 weeks: One high risk condition: no single high risk condition  MORE than one moderate risk condition: obesity Aspirin was not  recommended today based upon above risk factors (one high risk condition or more than one moderate risk factor)   2. Pregnancy issues include the following and were addressed as appropriate today:  Obesity: counseled on healthy diet  Discuss necessity of Flu vaccine this fall  Problem list  and pregnancy box updated: Yes.   Follow up 4 weeks.

## 2022-11-27 ENCOUNTER — Other Ambulatory Visit: Payer: Self-pay

## 2022-11-27 ENCOUNTER — Ambulatory Visit (INDEPENDENT_AMBULATORY_CARE_PROVIDER_SITE_OTHER): Payer: Medicaid Other | Admitting: Student

## 2022-11-27 VITALS — BP 125/82 | HR 87 | Wt 244.2 lb

## 2022-11-27 DIAGNOSIS — Z3482 Encounter for supervision of other normal pregnancy, second trimester: Secondary | ICD-10-CM | POA: Diagnosis present

## 2022-11-27 DIAGNOSIS — N3000 Acute cystitis without hematuria: Secondary | ICD-10-CM

## 2022-11-27 DIAGNOSIS — O99212 Obesity complicating pregnancy, second trimester: Secondary | ICD-10-CM

## 2022-11-27 LAB — POCT URINALYSIS DIP (MANUAL ENTRY)
Bilirubin, UA: NEGATIVE
Glucose, UA: NEGATIVE mg/dL
Ketones, POC UA: NEGATIVE mg/dL
Nitrite, UA: NEGATIVE
Protein Ur, POC: NEGATIVE mg/dL
Spec Grav, UA: 1.02 (ref 1.010–1.025)
Urobilinogen, UA: 0.2 U/dL
pH, UA: 6 (ref 5.0–8.0)

## 2022-11-27 LAB — POCT 1 HR PRENATAL GLUCOSE: Glucose 1 Hr Prenatal, POC: 166 mg/dL

## 2022-11-27 NOTE — Patient Instructions (Signed)
It was great to see you today! Thank you for choosing [facility] for your obstetric care. Katherine Love was seen for OB visit.   Baby is doing well, great job!! We will need an anatomy ultrasound of baby in the coming weeks, but we will schedule this. We are checking your Urine for cure of your UTI.  We are also checking a Glucola to screen for diabetes   Commonly Asked Questions During Pregnancy  How Will I Feel When I'm Pregnant? Pregnancy symptoms in the first trimester of pregnancy may not appear until the middle or end of the second month. Hormonal changes will cause tenderness in your breasts, and you may begin to feel more tired than usual. Food cravings, an increase in the need to urinate, and morning sickness may all be more noticeable.  Pregnancy symptoms in the second trimester are more prominent. You may start to feel the baby move and become more active. Dental issues, nasal/sinus problems, and skin irritations can begin to appear. Heartburn, leg cramps, dizziness, and a vaginal discharge are also common. Every woman is different when it comes to the symptoms they experience, and some may not experience any at all. Pregnancy symptoms in the third trimester can include increased frequency in urination, leg cramps, constipation, ligament pain in the abdomen, and weight gain. Back pain and Braxton Hicks contractions will become increasingly more common.  Why is nutrition during pregnancy important? Eating well is one of the best things you can do during pregnancy. Good nutrition helps you handle the extra demands on your body as your pregnancy progresses. The goal is to balance getting enough nutrients to support the growth of your fetus and maintaining a healthy weight.  How much water should I drink during pregnancy? During pregnancy you should drink 8 to 12 cups (64 to 96 ounces) of water every day. Water has many benefits. It aids digestion and helps form the amniotic  fluid around the fetus. Water also helps nutrients circulate in the body and helps waste leave the body.  What can I do to help with nausea? Eat dry toast or crackers in the morning before you get out of bed to avoid moving around on an empty stomach. Eat five or six "mini meals" a day to ensure that your stomach is never empty. Eat frequent bites of foods like nuts, fruits, or crackers.  What can help with constipation during pregnancy? Constipation is common near the end of pregnancy. Eating more foods with fiber can help fight constipation. Fiber is found in fruits, vegetables, whole grains, beans, nuts, and seeds. You should aim for about 25 grams of fiber in your diet each day. Drink a lot of water as you increase your fiber intake.  How much coffee can I drink while I'm pregnant? Research suggests that moderate caffeine consumption (less than 200 milligrams per day) does not cause miscarriage or preterm birth. That's the amount in one 12-ounce cup of coffee. Remember that caffeine also is found in tea, chocolate, energy drinks, and soft drinks. Caffeine can interfere with sleep and contribute to nausea and light-headedness. Caffeine also can increase urination and lead to dehydration.  What can I do to prevent or ease back pain during pregnancy? There are several things you can do to prevent or ease back pain. For example, wear supportive clothing and shoes. Pay attention to your position when sitting, sleeping, and lifting things. If you need to stand for a long time, rest one foot on  a stool or a box to take the strain off your back. You also can use heat or cold to soothe sore muscles.  Is it safe to exercise during pregnancy? If you are healthy and your pregnancy is normal, it is safe to continue or start regular physical activity. Physical activity does not increase your risk of miscarriage, low birth weight, or early delivery. It's still important to discuss exercise with your ob-gyn  provider during your early prenatal visits.   What are the benefits of exercise during pregnancy? Regular exercise during pregnancy benefits you and your fetus in these key ways: Reduces back pain Eases constipation May decrease your risk of gestational diabetes, preeclampsia, and cesarean birth Promotes healthy weight gain during pregnancy Improves your overall fitness and strengthens your heart and blood vessels Helps you to lose the baby weight after your baby is born  Is it safe to dye my hair during pregnancy? Yes, it's safe. Only a small amount of chemicals from hair dye is absorbed through the scalp.  Is it safe to keep a cat during pregnancy? Yes, you can keep your cat. You may have heard that cat feces can carry the infection toxoplasmosis. This infection is only found in cats who go outdoors and hunt prey, such as mice and other rodents. If you do have a cat who goes outdoors or eats prey, have someone else take over daily cleaning the litter box. This will keep you away from any cat feces. If you have an indoor cat who only eats cat food and doesn't have contact with outside animals, your risk of toxoplasmosis is very low.  What substances should I avoid during pregnancy? During pregnancy, women should not use tobacco, alcohol, marijuana, illegal drugs, or prescription medications for nonmedical reasons. Avoiding these substances and getting regular prenatal care are important to having a healthy pregnancy and a healthy baby.   What foods do I need to avoid in pregnancy? To help prevent listeriosis, avoid eating the following foods while you are pregnant: Unpasteurized milk and foods made with unpasteurized milk, including soft cheeses Hot dogs and luncheon meats, unless they are heated until steaming hot just before serving Unwashed raw produce such as fruits and vegetables  Avoid all raw and undercooked seafood, eggs, meat, and poultry while you are pregnant. Do not eat sushi  made with raw fish (cooked sushi is safe). Cooking and pasteurization are the only ways to kill Listeria.  Limit your exposure to mercury by not eating bigeye tuna, king mackerel, marlin, orange roughy, shark, swordfish, or tilefish. Limit eating white (albacore) tuna to 6 ounces a week. You do not have to avoid all fish during pregnancy. In fact, fish and shellfish are nutritious foods with vital nutrients for a pregnant woman and her fetus. Be sure to eat at least 8-12 ounces of low-mercury fish and shellfish per week.  Is travel safe to do during pregnancy? In most cases, pregnant women can travel safely until close to their due dates. But travel may not be recommended for women who have pregnancy complications. If you are planning a trip, talk with your (ob-gyn) provider. And no matter how you choose to travel, think ahead about your comfort and safety.  Can I use a sauna or hot tub early in pregnancy? It's best not to. Your core body temperature rises when you use saunas and hot tubs. This rise in temperature can be harmful for your fetus.  Can I get a massage while pregnant? Yes. Massage  is a good way to relax and improve circulation. The best position for a massage while you're pregnant is lying on your side, rather than facedown. Some massage tables have a cut-out for the belly, allowing you to lie facedown comfortably. Tell your massage therapist that you're pregnant if you're not showing yet. Many health spas offer special prenatal massages done by therapists who are trained to work on pregnant women.  Is Having Dental Work While Pregnant Safe? Pregnancy and dental work questions are common for expecting moms. Preventive dental cleanings and annual exams during pregnancy are not only safe but are recommended. The rise in hormone levels during pregnancy causes the gums to swell, bleed, and trap food causing increased irritation to your gums. Preventive dental work while pregnant is essential  to avoid oral infections such as gum disease, which has been linked to preterm birth. The American Dental Association (ADA) recommends pregnant women eat a balanced diet, brush their teeth thoroughly with ADA-approved fluoride toothpaste twice a day, and floss daily. Have preventive exams and cleanings during your pregnancy. Let your dentist know you are pregnant. Postpone non-emergency dental work until the second trimester or after delivery, if possible. Elective procedures should be postponed until after the delivery.  If you haven't already, sign up for My Chart to have easy access to your labs results, and communication with your primary care physician.  We are checking some labs today. If they are abnormal, I will call you. If they are normal, I will send you a MyChart message (if it is active) or a letter in the mail. If you do not hear about your labs in the next 2 weeks, please call the office.   You should return to our clinic 1 month  I recommend that you always bring your medications to each appointment as this makes it easy to ensure you are on the correct medications and helps [place] not miss refills when you need them.   Please arrive 15 minutes before your appointment to ensure smooth check in process.  We appreciate your efforts in making this happen.  Please call the clinic at [phone] if your symptoms worsen or you have any concerns.  Thank you for allowing me to participate in your care, Bess Kinds, MD 11/27/2022, 11:45 AM PGY-3, [facility]

## 2022-11-27 NOTE — Progress Notes (Cosign Needed Addendum)
  Patient Name: [name] Date of Birth: 02-07-2001 [facility] Prenatal Visit  Katherine Love is a 22 y.o. [id number] at [redacted]w[redacted]d here for routine follow up. She is dated by ultrasound.  She reports no bleeding, no contractions, no leaking, and pressure/pelvic pain .  She denies vaginal bleeding.  See flow sheet for details.  Vitals:   11/27/22 1059  BP: 125/82  Pulse: 87    Pain/pressure in lower abdomen, happens when getting up, or standing for 2 long. Feels like pelvis is being pushed apart, reminds her of her last preg when it was much later. Feeling a lot of pressure/cramping. No contractions but pain is 6-7/10, no leakage of fluid or bleeding. Still having nausea and vomiting from time to time, but medicine is helping.   A/P: Pregnancy at [redacted]w[redacted]d.  Doing well.    Routine Prenatal Care:  Dating reviewed, dating tab is correct Fetal heart tones Appropriate Influenza vaccine not administered for timing, please give at next visit.   COVID vaccination was discussed and Decline's hasn't been vaccinated.  The patient has the following indication for screening preexisting diabetes: BMI > 25 and sedentary lifestyle. Anatomy ultrasound ordered to be scheduled at 18-20 weeks. Patient is not interested in genetic screening. As she is past 13 weeks and 6 days, a Quad screen  was offered.  Pregnancy education including expected weight gain in pregnancy, OTC medication use, continued use of prenatal vitamin, smoking cessation if applicable, and nutrition in pregnancy.   Bleeding and pain precautions reviewed. The patient has the following indications for aspirinto begin 81 mg at 12-16 weeks: One high risk condition: no single high risk condition  MORE than one moderate risk condition: obesity Aspirin was not  recommended today based upon above risk factors (one high risk condition or more than one moderate risk factor)   2. Pregnancy issues include the following and were  addressed as appropriate today:  UTI at 12 weeks, treated in MAU 10/26/22 w/ duriceff, Ucx showed 100,000 CFU ecoli. Will retest urine today, for cure. She repot's that she feel's fine and took all antibiotics.  1 hr GTT testing for early DM screening, given BMI and sedentary lifestyle Pelvic pressure/pain felt to be coming from round ligament, encouraged Tylenol use prn, 3g max daily Problem list  and pregnancy box updated: Yes.   Follow up 4 weeks.

## 2022-11-28 ENCOUNTER — Other Ambulatory Visit: Payer: Medicaid Other

## 2022-11-28 DIAGNOSIS — Z3482 Encounter for supervision of other normal pregnancy, second trimester: Secondary | ICD-10-CM

## 2022-11-28 DIAGNOSIS — R110 Nausea: Secondary | ICD-10-CM

## 2022-11-28 LAB — URINALYSIS, MICROSCOPIC ONLY
Casts: NONE SEEN /LPF
Epithelial Cells (non renal): >10 /HPF — AB (ref 0–10)
WBC, UA: NONE SEEN /HPF (ref 0–5)

## 2022-11-28 LAB — POCT CBG (FASTING - GLUCOSE)-MANUAL ENTRY: Glucose Fasting, POC: 94 mg/dL (ref 70–99)

## 2022-11-28 MED ORDER — ONDANSETRON 4 MG PO TBDP
4.0000 mg | ORAL_TABLET | Freq: Once | ORAL | Status: AC
  Administered 2022-11-28: 4 mg via ORAL

## 2022-11-29 ENCOUNTER — Encounter: Payer: Self-pay | Admitting: Student

## 2022-11-29 LAB — URINE CULTURE, OB REFLEX

## 2022-11-29 LAB — CULTURE, OB URINE

## 2022-11-29 LAB — GESTATIONAL GLUCOSE TOLERANCE
Glucose, Fasting: 84 mg/dL (ref 70–94)
Glucose, GTT - 1 Hour: 168 mg/dL (ref 70–179)
Glucose, GTT - 2 Hour: 194 mg/dL — ABNORMAL HIGH (ref 70–154)
Glucose, GTT - 3 Hour: 98 mg/dL (ref 70–139)

## 2022-12-04 ENCOUNTER — Other Ambulatory Visit: Payer: Self-pay | Admitting: Student

## 2022-12-04 DIAGNOSIS — Z3A18 18 weeks gestation of pregnancy: Secondary | ICD-10-CM

## 2022-12-06 ENCOUNTER — Encounter: Payer: Self-pay | Admitting: Student

## 2022-12-11 ENCOUNTER — Encounter: Payer: Self-pay | Admitting: Student

## 2022-12-24 ENCOUNTER — Other Ambulatory Visit: Payer: Self-pay | Admitting: *Deleted

## 2022-12-24 ENCOUNTER — Ambulatory Visit: Payer: Medicaid Other | Attending: Family Medicine

## 2022-12-24 ENCOUNTER — Ambulatory Visit: Payer: Medicaid Other | Admitting: *Deleted

## 2022-12-24 VITALS — BP 126/78 | HR 90

## 2022-12-24 DIAGNOSIS — O09212 Supervision of pregnancy with history of pre-term labor, second trimester: Secondary | ICD-10-CM | POA: Diagnosis not present

## 2022-12-24 DIAGNOSIS — O99212 Obesity complicating pregnancy, second trimester: Secondary | ICD-10-CM | POA: Diagnosis present

## 2022-12-24 DIAGNOSIS — O360120 Maternal care for anti-D [Rh] antibodies, second trimester, not applicable or unspecified: Secondary | ICD-10-CM

## 2022-12-24 DIAGNOSIS — E669 Obesity, unspecified: Secondary | ICD-10-CM

## 2022-12-24 DIAGNOSIS — O365920 Maternal care for other known or suspected poor fetal growth, second trimester, not applicable or unspecified: Secondary | ICD-10-CM

## 2022-12-24 DIAGNOSIS — Z3A20 20 weeks gestation of pregnancy: Secondary | ICD-10-CM

## 2022-12-24 DIAGNOSIS — Z362 Encounter for other antenatal screening follow-up: Secondary | ICD-10-CM

## 2022-12-24 DIAGNOSIS — Z3A18 18 weeks gestation of pregnancy: Secondary | ICD-10-CM | POA: Diagnosis not present

## 2022-12-24 DIAGNOSIS — O321XX0 Maternal care for breech presentation, not applicable or unspecified: Secondary | ICD-10-CM | POA: Diagnosis not present

## 2022-12-30 ENCOUNTER — Ambulatory Visit: Payer: Medicaid Other | Admitting: Student

## 2022-12-30 ENCOUNTER — Other Ambulatory Visit: Payer: Self-pay | Admitting: Student

## 2022-12-30 ENCOUNTER — Other Ambulatory Visit: Payer: Self-pay

## 2022-12-30 VITALS — BP 141/78 | HR 89 | Wt 253.8 lb

## 2022-12-30 DIAGNOSIS — Z3A22 22 weeks gestation of pregnancy: Secondary | ICD-10-CM

## 2022-12-30 DIAGNOSIS — Z23 Encounter for immunization: Secondary | ICD-10-CM | POA: Diagnosis present

## 2022-12-30 DIAGNOSIS — O132 Gestational [pregnancy-induced] hypertension without significant proteinuria, second trimester: Secondary | ICD-10-CM

## 2022-12-30 DIAGNOSIS — R030 Elevated blood-pressure reading, without diagnosis of hypertension: Secondary | ICD-10-CM

## 2022-12-30 LAB — COMPREHENSIVE METABOLIC PANEL
ALT: 6 [IU]/L (ref 0–32)
AST: 13 [IU]/L (ref 0–40)
Albumin: 4 g/dL (ref 4.0–5.0)
Alkaline Phosphatase: 93 [IU]/L (ref 44–121)
BUN/Creatinine Ratio: 11 (ref 9–23)
BUN: 5 mg/dL — ABNORMAL LOW (ref 6–20)
Bilirubin Total: <0.2 mg/dL (ref 0.0–1.2)
CO2: 21 mmol/L (ref 20–29)
Calcium: 8.9 mg/dL (ref 8.7–10.2)
Chloride: 105 mmol/L (ref 96–106)
Creatinine, Ser: 0.46 mg/dL — ABNORMAL LOW (ref 0.57–1.00)
Globulin, Total: 2.2 g/dL (ref 1.5–4.5)
Glucose: 139 mg/dL — ABNORMAL HIGH (ref 70–99)
Potassium: 4 mmol/L (ref 3.5–5.2)
Sodium: 139 mmol/L (ref 134–144)
Total Protein: 6.2 g/dL (ref 6.0–8.5)
eGFR: 137 mL/min/{1.73_m2} (ref >59)

## 2022-12-30 LAB — CBC
Hematocrit: 37.4 % (ref 34.0–46.6)
Hemoglobin: 12.3 g/dL (ref 11.1–15.9)
MCH: 30.8 pg (ref 26.6–33.0)
MCHC: 32.9 g/dL (ref 31.5–35.7)
MCV: 94 fL (ref 79–97)
Platelets: 301 10*3/uL (ref 150–450)
RBC: 4 x10E6/uL (ref 3.77–5.28)
RDW: 12.9 % (ref 11.7–15.4)
WBC: 10.1 10*3/uL (ref 3.4–10.8)

## 2022-12-30 NOTE — Progress Notes (Unsigned)
  [facility] Prenatal Visit  Katherine Love is a 22 y.o. [phone] at [redacted]w[redacted]d here for routine follow up. She is dated by early ultrasound.  She reports fatigue, no bleeding, no contractions, and no leaking.  She reports good fetal movement. No bleeding, loss of fluid, contractions. See flow sheet for details. Vitals:   12/30/22 1336 12/30/22 1404  BP: 134/71 (!) 141/78  Pulse:      A/P: Pregnancy at [redacted]w[redacted]d.  Doing well.   Dating reviewed, dating tab is correct Fetal heart tones Appropriate Fundal height within expected range.  Anatomy ultrasound reviewed and notable for normal.  Influenza vaccine administered today. Marland Kitchen  COVID vaccination was discussed and declined.  Indications for screening for preexisting diabetes include: Already tested, passed 3 - Hr GTT. Will repeat at 28 wks. Pregnancy education provided on the following topics: fetal growth and movement, ultrasound assessment, and upcoming laboratory assessment.   The patient has the following indications for aspirinto begin 81 mg at 12-16 weeks: One high risk condition: no single high risk condition  MORE than one moderate risk condition: obesity and none Aspirin was not  recommended today based upon above risk factors (one high risk condition or more than one moderate risk factor)  [facility] during second trimester on 01/30/23. Preterm labor precautions given.   2. Pregnancy issues include the following and were addressed as appropriate today:   Round Ligament Pain  Continues to have pelvic pain, but well tolerated.   Elevated BP / Gestational HTN Patient BP elevated today at 141/78 w/ recheck stable. She denies any HA, RUQ pain, or abnormal swelling. Patient would be early for preeclampsia, but will still obtain labs.  -CBC, CMP, Urine Prot/Creat ratio   Problem list and pregnancy box updated: Yes.     Follow up 4 weeks.

## 2022-12-30 NOTE — Patient Instructions (Addendum)
It was great to see you today! Thank you for choosing [facility] for your obstetric care. Katherine Love was seen for OB visit.   Baby is doing well, great job!! Your Blood Pressure is slightly elevated, we are going to keep an eye on this. We are getting some labs today, if the labs are abnormal, we may send you to the Maternity Assessment Unit (MAU) *Be on the look out for intense headaches, that wont go away with pain medicine, abdominal pain, or sudden/greatly increased swelling in your body.  .   Commonly Asked Questions During Pregnancy  How Will I Feel When I'm Pregnant? Pregnancy symptoms in the first trimester of pregnancy may not appear until the middle or end of the second month. Hormonal changes will cause tenderness in your breasts, and you may begin to feel more tired than usual. Food cravings, an increase in the need to urinate, and morning sickness may all be more noticeable.  Pregnancy symptoms in the second trimester are more prominent. You may start to feel the baby move and become more active. Dental issues, nasal/sinus problems, and skin irritations can begin to appear. Heartburn, leg cramps, dizziness, and a vaginal discharge are also common. Every woman is different when it comes to the symptoms they experience, and some may not experience any at all. Pregnancy symptoms in the third trimester can include increased frequency in urination, leg cramps, constipation, ligament pain in the abdomen, and weight gain. Back pain and Braxton Hicks contractions will become increasingly more common.  Why is nutrition during pregnancy important? Eating well is one of the best things you can do during pregnancy. Good nutrition helps you handle the extra demands on your body as your pregnancy progresses. The goal is to balance getting enough nutrients to support the growth of your fetus and maintaining a healthy weight.  How much water should I drink during pregnancy? During  pregnancy you should drink 8 to 12 cups (64 to 96 ounces) of water every day. Water has many benefits. It aids digestion and helps form the amniotic fluid around the fetus. Water also helps nutrients circulate in the body and helps waste leave the body.  What can I do to help with nausea? Eat dry toast or crackers in the morning before you get out of bed to avoid moving around on an empty stomach. Eat five or six "mini meals" a day to ensure that your stomach is never empty. Eat frequent bites of foods like nuts, fruits, or crackers.  What can help with constipation during pregnancy? Constipation is common near the end of pregnancy. Eating more foods with fiber can help fight constipation. Fiber is found in fruits, vegetables, whole grains, beans, nuts, and seeds. You should aim for about 25 grams of fiber in your diet each day. Drink a lot of water as you increase your fiber intake.  How much coffee can I drink while I'm pregnant? Research suggests that moderate caffeine consumption (less than 200 milligrams per day) does not cause miscarriage or preterm birth. That's the amount in one 12-ounce cup of coffee. Remember that caffeine also is found in tea, chocolate, energy drinks, and soft drinks. Caffeine can interfere with sleep and contribute to nausea and light-headedness. Caffeine also can increase urination and lead to dehydration.  What can I do to prevent or ease back pain during pregnancy? There are several things you can do to prevent or ease back pain. For example, wear supportive clothing and shoes.  Pay attention to your position when sitting, sleeping, and lifting things. If you need to stand for a long time, rest one foot on a stool or a box to take the strain off your back. You also can use heat or cold to soothe sore muscles.  Is it safe to exercise during pregnancy? If you are healthy and your pregnancy is normal, it is safe to continue or start regular physical activity. Physical  activity does not increase your risk of miscarriage, low birth weight, or early delivery. It's still important to discuss exercise with your ob-gyn provider during your early prenatal visits.   What are the benefits of exercise during pregnancy? Regular exercise during pregnancy benefits you and your fetus in these key ways: Reduces back pain Eases constipation May decrease your risk of gestational diabetes, preeclampsia, and cesarean birth Promotes healthy weight gain during pregnancy Improves your overall fitness and strengthens your heart and blood vessels Helps you to lose the baby weight after your baby is born  Is it safe to dye my hair during pregnancy? Yes, it's safe. Only a small amount of chemicals from hair dye is absorbed through the scalp.  Is it safe to keep a cat during pregnancy? Yes, you can keep your cat. You may have heard that cat feces can carry the infection toxoplasmosis. This infection is only found in cats who go outdoors and hunt prey, such as mice and other rodents. If you do have a cat who goes outdoors or eats prey, have someone else take over daily cleaning the litter box. This will keep you away from any cat feces. If you have an indoor cat who only eats cat food and doesn't have contact with outside animals, your risk of toxoplasmosis is very low.  What substances should I avoid during pregnancy? During pregnancy, women should not use tobacco, alcohol, marijuana, illegal drugs, or prescription medications for nonmedical reasons. Avoiding these substances and getting regular prenatal care are important to having a healthy pregnancy and a healthy baby.   What foods do I need to avoid in pregnancy? To help prevent listeriosis, avoid eating the following foods while you are pregnant: Unpasteurized milk and foods made with unpasteurized milk, including soft cheeses Hot dogs and luncheon meats, unless they are heated until steaming hot just before serving Unwashed  raw produce such as fruits and vegetables  Avoid all raw and undercooked seafood, eggs, meat, and poultry while you are pregnant. Do not eat sushi made with raw fish (cooked sushi is safe). Cooking and pasteurization are the only ways to kill Listeria.  Limit your exposure to mercury by not eating bigeye tuna, king mackerel, marlin, orange roughy, shark, swordfish, or tilefish. Limit eating white (albacore) tuna to 6 ounces a week. You do not have to avoid all fish during pregnancy. In fact, fish and shellfish are nutritious foods with vital nutrients for a pregnant woman and her fetus. Be sure to eat at least 8-12 ounces of low-mercury fish and shellfish per week.  Is travel safe to do during pregnancy? In most cases, pregnant women can travel safely until close to their due dates. But travel may not be recommended for women who have pregnancy complications. If you are planning a trip, talk with your (ob-gyn) provider. And no matter how you choose to travel, think ahead about your comfort and safety.  Can I use a sauna or hot tub early in pregnancy? It's best not to. Your core body temperature rises when you use  saunas and hot tubs. This rise in temperature can be harmful for your fetus.  Can I get a massage while pregnant? Yes. Massage is a good way to relax and improve circulation. The best position for a massage while you're pregnant is lying on your side, rather than facedown. Some massage tables have a cut-out for the belly, allowing you to lie facedown comfortably. Tell your massage therapist that you're pregnant if you're not showing yet. Many health spas offer special prenatal massages done by therapists who are trained to work on pregnant women.  Is Having Dental Work While Pregnant Safe? Pregnancy and dental work questions are common for expecting moms. Preventive dental cleanings and annual exams during pregnancy are not only safe but are recommended. The rise in hormone levels during  pregnancy causes the gums to swell, bleed, and trap food causing increased irritation to your gums. Preventive dental work while pregnant is essential to avoid oral infections such as gum disease, which has been linked to preterm birth. The American Dental Association (ADA) recommends pregnant women eat a balanced diet, brush their teeth thoroughly with ADA-approved fluoride toothpaste twice a day, and floss daily. Have preventive exams and cleanings during your pregnancy. Let your dentist know you are pregnant. Postpone non-emergency dental work until the second trimester or after delivery, if possible. Elective procedures should be postponed until after the delivery.  If you haven't already, sign up for My Chart to have easy access to your labs results, and communication with your primary care physician.  We are checking some labs today. If they are abnormal, I will call you. If they are normal, I will send you a MyChart message (if it is active) or a letter in the mail. If you do not hear about your labs in the next 2 weeks, please call the office.   You should return to our clinic No follow-ups on file.  I recommend that you always bring your medications to each appointment as this makes it easy to ensure you are on the correct medications and helps [place] not miss refills when you need them.   Please arrive 15 minutes before your appointment to ensure smooth check in process.  We appreciate your efforts in making this happen.  Please call the clinic at [phone] if your symptoms worsen or you have any concerns.  Thank you for allowing me to participate in your care, Bess Kinds, MD 12/30/2022, 2:02 PM PGY-3, [facility]

## 2022-12-30 NOTE — Progress Notes (Signed)
Received page regarding stat labs for patient. CBC and CMP largely unremarkable which is reassuring. Will forward to ordering provider.   Katherine Love Geophysical data processor

## 2022-12-31 LAB — PROTEIN / CREATININE RATIO, URINE
Creatinine, Urine: 48.9 mg/dL
Protein, Ur: 11.3 mg/dL
Protein/Creat Ratio: 231 mg/g{creat} — ABNORMAL HIGH (ref 0–200)

## 2023-01-01 DIAGNOSIS — O139 Gestational [pregnancy-induced] hypertension without significant proteinuria, unspecified trimester: Secondary | ICD-10-CM | POA: Insufficient documentation

## 2023-01-01 NOTE — Assessment & Plan Note (Signed)
Patient noted to have elevated BP x 2 of 140's/70's. Patient denies any HA, RUQ pain, or abnormal swelling. Will continue to monitor for now and obtain labs for baseline. Patient would be an early presentation of preeclampsia.  -CBC, CMP, Urine Prot/Cr

## 2023-01-08 ENCOUNTER — Encounter: Payer: Self-pay | Admitting: Student

## 2023-01-08 NOTE — Patient Instructions (Incomplete)
It was great to see you! Thank you for allowing me to participate in your care!  Our plans for today:  - Blood Pressure check Your blood pressure looks great today. Please check it at home, you want your blood pressure to be under 140/90. If you notice it's elevated on two different checks, make an appointment to be seen.  - Pre-Eclampsia Watch out for symptoms of Pre-eclampsia. This is a medical emergency and can have any of the following: Head ache that won't respond to tylenol Right Upper stomach, abdominal pain Chest pain Shortness of breath Changes in vision Sudden swelling in hands/feet  *If you have any of these symptoms, please go to Maternity Assessment Unit (MAU) [facility].   - Resources for Mood / Mental Health   If you ever feel like you are in a life threatening emergency/crisis, please call Suicide Hotline at 65  Psychiatry Resource List (Adults and Children)  Most of these providers will take Medicaid. please consult your insurance for a complete and updated list of available providers. When calling to make an appointment have your insurance information available to confirm you are covered.   [facility]:Psychiatry and Counseling [address] [place], [place] [address] [phone]  [facility]  [address] [place], [place] [phone] Crisis [phone]   [facility]:   [facility]: [address]     [phone]   [place]: [address] [place]. [place],        [phone] [place]: [address] [phone],    [phone] [place]: [phone] [address],                   [phone] Children: [facility] [address]         [phone]  [facility] (virtual only) [phone]   [facility]  (Psychiatry only; Adults /children 12 and over, will take Medicaid)  [address] Laurell Josephs [address], [place], [place]  [address]       [phone]   [facility] (Psychiatry & counseling ; adults & children ; will take Medicaid [address]  [place] [address]  Go on-line to complete referral ( https://www.[email or web address].org/en/make-[email or web address]-referral [phone]    (Spanish speaking therapists)  [facility]  Psychiatry & counseling; Adults and children;  Call Registration prior to scheduling an appointment [phone] [address]    [place], [place] [address]    [phone]  [facility] (Psychiatry & counseling; adults & children; Medicare no Medicaid)  [address]   [place], [place]  [address]      [phone]    [facility] (up to age 51)  Psychiatry & counseling ,will take Medicaid, must do counseling to receive psychiatry services  [address] [place] [address]        [phone]  [facility] (Psychiatry & counseling; adults & children; will take Medicaid) Will need a referral from provider [address],  [place], [place]  [phone]   [facility] --- Walk-In Mon-Friday 8am-3pm ( will take Medicaid, Psychiatry, Adults & children,  [address] [place], [place]   [phone]   [facility] [place]--, Walk-in M-F 8am-12pm and 1pm -3pm   (Counseling, Psychiatry, will take Medicaid, adults & children)  [address] [place], [place]  [phone]  Therapy and Counseling Resources   Most providers on this list will take Medicaid. Patients with commercial insurance or Medicare should contact their insurance company to get a list of in network providers.  [employer] (takes children) Location 1: [address] [place], [place] [address] Location 2: [address] [place], [place] [address] [phone]   [facility] (spanish speaking therapist available)(habla espanol)(take medicare and medicaid)  [address],  [place], [place] [address], [email or web address] [phone]  [facility]:Psychiatry and Counseling [address] [place], [place] [address] [phone]  [facility]   [address] Suite [place], [place] [address]      [phone]  [facility] (spanish available) [address]  [place], [place] [address] [phone]  [facility] (take [facility] and medicare) [address]  [place], [place] [address]       [phone]     [facility] (virtual only) [phone]  Jovita Kussmaul [facility] [address] [place], [place] [phone]  [facility]:  [address] [place] [phone]  [facility]:  [address] Hessie Diener [phone]  [facility] (under & uninsured) [address]   [place] [phone]    [email or web address]    [facility] [address]  [place]    [phone]  [facility] -[address]     Phone:  [phone]     [facility]-  [address]  [phone]   [facility] [address]      [place], [place] [phone]  [facility]: [address] [place], [place], [place]  [phone]   [facility] (Spanish therapist) [email or web address]/  [address] [place]  [address]   [place] [address]    [phone]    [facility]   [address],  [place], [place]  [phone]   [facility]  [address] [place]  [phone]  [facility]  [address] [place], [place]        [phone]  Open Access/Walk In Clinic under & uninsured  [facility]  [address] [place], [place] [phone] Crisis [phone]  [facility] [place],  (Spanish)   [address]  [place], [place]: [phone]) 8:30 - 12; 1 - 2:30  [facility] [employer],  [address] [place], [place]    [phone]):8:30 - 12; 2 - 3PM  [facility] [employer],  [address]  [place]; [phone]):   Mon - Fri 8 AM - 5 PM  [facility] [address] [place]  MWF 12:30 to 3:00 or call to schedule an appointment  [phone]  Specific Provider options Psychology Today  [email or web address]/us click on find a therapist  enter your zip code left side and select or tailor a therapist for your specific need.   [facility] Provider Directory [email or web address]  (Medicaid)   Follow all drop down to find a provider  Social Support program Mental Health [place] [phone] or Photo[employer].pl [address], [place], [place] Recovery support and educational   24- Hour Availability:   [facility]  [address] [place], [place] [phone] Crisis [phone]  [facility]  [phone]  [facility]  [phone]   [facility]  [phone] (after hours)  Therapeutic Alternative/Mobile Crisis   [phone]  [place] National Suicide Hotline  [phone] Len Childs)  Call 911 or go to emergency room  [facility]  [phone]);  [place] and [employer]  [phone]); [place], [place], [place], [place], [place], [place], [place]    Take care and seek immediate care sooner if you develop any concerns.   Dr. Bess Kinds, MD [facility]

## 2023-01-08 NOTE — Progress Notes (Signed)
  SUBJECTIVE:   CHIEF COMPLAINT / HPI:   BP Check -BP elevated at last OB visit. Comes in for recheck. BP Readings from Last 3 Encounters:  01/09/23 (!) 110/58  12/30/22 (!) 141/78  12/24/22 126/78   BP doing well today. No complaints.  Depression Patient notes she has been pressed, and thinking about self-harm lately.  Patient appreciates she is very busy at home and needs more support help.  Also reports extensive history of mental health issues, and self-harm.  Patient reports she has tried antidepressants on 4 different occasions without success.  Patient looking for therapy resources today.  Patient denies any SI or plans to and wife.  Teary-eyed today, while discussing.  PERTINENT  PMH / PSH:   Past Medical History:  Diagnosis Date   Anxiety    Depression    OBJECTIVE:  BP (!) 110/58   Pulse 93   Ht 5\' 5"  (1.651 m)   Wt 250 lb 6.4 oz (113.6 kg)   LMP 07/24/2022 (Approximate)   SpO2 100%   BMI 41.67 kg/m  Physical Exam Constitutional:      Appearance: Normal appearance.  Neurological:     Mental Status: She is alert.  Psychiatric:        Mood and Affect: Mood normal. Mood is not anxious. Affect is labile and tearful. Affect is not angry.        Speech: Speech normal. Speech is not delayed or tangential.        Behavior: Behavior normal. Behavior is not agitated or aggressive. Behavior is cooperative.     FHT: 150  ASSESSMENT/PLAN:  Elevated blood pressure affecting pregnancy in second trimester, antepartum Assessment & Plan: Patient comes in for follow-up of blood pressure.  Blood pressure noted to be elevated at last prenatal check.  Preeclampsia labs collected, all negative. Patient blood pressure appropriate today.  Will recommend daily blood pressure monitoring. - Continue to monitor - Follow-up at next prenatal visit - Recommend at home blood pressure monitoring   Depression, unspecified depression type Assessment & Plan: Patient appreciates that she  has been feeling depressed, and struggling with thoughts of self-harm.  She denies any thoughts of suicide, or any plan for self-harm.  Patient notes that she is very busy at home, and feels like she needs more support.  Patient also appreciating distant history of self-harm.  Patient tearful in office.  Patient open to counseling and medication, has tried 4 different antidepressants without success.  Will recommend patient seek out care for psychiatry,therapy, and follow-up with provider. - Therapy resources given - Psychiatry resources given - Consider GeneSight testing for most effective medications - Follow-up 1 week    No follow-ups on file. Bess Kinds, MD 01/11/2023, 5:20 PM PGY-3, [facility]

## 2023-01-09 ENCOUNTER — Encounter: Payer: Self-pay | Admitting: Student

## 2023-01-09 ENCOUNTER — Other Ambulatory Visit: Payer: Self-pay

## 2023-01-09 ENCOUNTER — Ambulatory Visit (INDEPENDENT_AMBULATORY_CARE_PROVIDER_SITE_OTHER): Payer: Medicaid Other | Admitting: Student

## 2023-01-09 VITALS — BP 110/58 | HR 93 | Ht 65.0 in | Wt 250.4 lb

## 2023-01-09 DIAGNOSIS — O162 Unspecified maternal hypertension, second trimester: Secondary | ICD-10-CM

## 2023-01-09 DIAGNOSIS — F32A Depression, unspecified: Secondary | ICD-10-CM | POA: Diagnosis present

## 2023-01-10 ENCOUNTER — Telehealth: Payer: Self-pay | Admitting: Student

## 2023-01-10 ENCOUNTER — Encounter: Payer: Self-pay | Admitting: Student

## 2023-01-10 MED ORDER — BLOOD PRESSURE MON/AUTO/WRIST DEVI: 1 refills | Status: AC

## 2023-01-10 NOTE — Telephone Encounter (Signed)
Follow-up call for patient, to discuss blood pressure cuff, and cramping.  Plan to message patient over MyChart.  Blood pressure cuff has been sent into pharmacy on [facility]  Want patient to go to the MAU for cramping to evaluate for UTI versus possible STI

## 2023-01-10 NOTE — Telephone Encounter (Signed)
Called to follow-up on patient message for blood pressure cuff, and cramping.  Patient did not answer, left voice message for call back.  Cramps  Intention was to inquire about cramping, when attending pending, how severe it is, patient's hydration status, any other symptoms, nausea, vomiting, fever, abdominal pain, back pain.  If patient having more symptoms, or severe cramping, or worsening cramping, would like patient to go to MAU to be assessed.  Provider has concern patient may possibly have UTI causing cramping or some other similar issue.  Blood pressure cuff  Provider sent him wrist BP cuff to pharmacy at [facility] list.  Posey Rea if patient's insurance will cover.  Likely they will not.  If blood pressure cuff is too expensive, patient can get 1 over [employer].  Recommend automatic blood pressure cuff, if affordable.   Provider will attempt to call back, and will message patient over MyChart.

## 2023-01-10 NOTE — Telephone Encounter (Signed)
See telephone note with PCP.

## 2023-01-11 DIAGNOSIS — O162 Unspecified maternal hypertension, second trimester: Secondary | ICD-10-CM | POA: Insufficient documentation

## 2023-01-11 NOTE — Assessment & Plan Note (Signed)
Patient appreciates that she has been feeling depressed, and struggling with thoughts of self-harm.  She denies any thoughts of suicide, or any plan for self-harm.  Patient notes that she is very busy at home, and feels like she needs more support.  Patient also appreciating distant history of self-harm.  Patient tearful in office.  Patient open to counseling and medication, has tried 4 different antidepressants without success.  Will recommend patient seek out care for psychiatry,therapy, and follow-up with provider. - Therapy resources given - Psychiatry resources given - Consider GeneSight testing for most effective medications - Follow-up 1 week

## 2023-01-11 NOTE — Assessment & Plan Note (Addendum)
Patient comes in for follow-up of blood pressure.  Blood pressure noted to be elevated at last prenatal check.  Preeclampsia labs collected, all negative. Patient blood pressure appropriate today.  Will recommend daily blood pressure monitoring. - Continue to monitor - Follow-up at next prenatal visit - Recommend at home blood pressure monitoring

## 2023-01-13 ENCOUNTER — Ambulatory Visit: Payer: Self-pay | Admitting: Student

## 2023-01-13 NOTE — Progress Notes (Deleted)
  SUBJECTIVE:   CHIEF COMPLAINT / HPI:   BP Check and Depression  Depression -Seen 10/17 noted depression and thoughts of self harm, no SI or plan to harm. Psychiatry and Therapy resources given  BP Check Elevated BP at last PNV, normal BP check the week following  PERTINENT  PMH / PSH: ***  Past Medical History:  Diagnosis Date   Anxiety    Depression    OBJECTIVE:  LMP 07/24/2022 (Approximate)  Physical Exam   ASSESSMENT/PLAN:  There are no diagnoses linked to this encounter. No follow-ups on file. Bess Kinds, MD 01/13/2023, 8:09 AM PGY-***, [facility] {    This will disappear when note is signed, click to select method of visit    :1}

## 2023-01-21 ENCOUNTER — Ambulatory Visit: Payer: Medicaid Other | Attending: Maternal & Fetal Medicine

## 2023-01-21 ENCOUNTER — Other Ambulatory Visit: Payer: Self-pay | Admitting: *Deleted

## 2023-01-21 ENCOUNTER — Other Ambulatory Visit: Payer: Self-pay

## 2023-01-21 DIAGNOSIS — O360120 Maternal care for anti-D [Rh] antibodies, second trimester, not applicable or unspecified: Secondary | ICD-10-CM

## 2023-01-21 DIAGNOSIS — O09212 Supervision of pregnancy with history of pre-term labor, second trimester: Secondary | ICD-10-CM

## 2023-01-21 DIAGNOSIS — E669 Obesity, unspecified: Secondary | ICD-10-CM

## 2023-01-21 DIAGNOSIS — O99212 Obesity complicating pregnancy, second trimester: Secondary | ICD-10-CM | POA: Diagnosis present

## 2023-01-21 DIAGNOSIS — Z3A24 24 weeks gestation of pregnancy: Secondary | ICD-10-CM

## 2023-01-21 DIAGNOSIS — Z3482 Encounter for supervision of other normal pregnancy, second trimester: Secondary | ICD-10-CM

## 2023-01-21 DIAGNOSIS — O99213 Obesity complicating pregnancy, third trimester: Secondary | ICD-10-CM

## 2023-01-21 DIAGNOSIS — O09293 Supervision of pregnancy with other poor reproductive or obstetric history, third trimester: Secondary | ICD-10-CM

## 2023-01-21 DIAGNOSIS — O365920 Maternal care for other known or suspected poor fetal growth, second trimester, not applicable or unspecified: Secondary | ICD-10-CM

## 2023-01-21 DIAGNOSIS — O35BXX0 Maternal care for other (suspected) fetal abnormality and damage, fetal cardiac anomalies, not applicable or unspecified: Secondary | ICD-10-CM | POA: Diagnosis not present

## 2023-01-21 DIAGNOSIS — Z362 Encounter for other antenatal screening follow-up: Secondary | ICD-10-CM | POA: Insufficient documentation

## 2023-01-29 LAB — PANORAMA PRENATAL TEST FULL PANEL:PANORAMA TEST PLUS 5 ADDITIONAL MICRODELETIONS: FETAL FRACTION: 7.5

## 2023-01-30 ENCOUNTER — Encounter: Payer: Self-pay | Admitting: Student

## 2023-01-30 ENCOUNTER — Telehealth: Payer: Self-pay

## 2023-01-30 NOTE — Telephone Encounter (Signed)
I left a voicemail asking patient to call back to discuss test results.

## 2023-02-03 ENCOUNTER — Telehealth: Payer: Self-pay

## 2023-02-03 NOTE — Telephone Encounter (Signed)
Called to return Panorama result (low risk female). Left voicemail with callback number.  Vivien Rota, Genetic Counseling Student Sheppard Plumber, MS Genetic Counselor [facility] for Maternal Fetal Care [phone]

## 2023-02-03 NOTE — Telephone Encounter (Signed)
The patient called back to discuss her test results. We reviewed her Panorama non-invasive prenatal screening (NIPS) results returned as low-risk, consistent with a female fetus. This significantly reduces but does not eliminate the chance that the fetus is affected with trisomy 29 (Down syndrome), trisomy 57, trisomy 34, common sex chromosome aneuploidies, and triploidy. Please see result for details. There are many genetic conditions that cannot be detected by this screen.  Sheppard Plumber, MS Genetic Counselor [facility] for Maternal Fetal Care [phone]

## 2023-02-06 ENCOUNTER — Encounter: Payer: Self-pay | Admitting: *Deleted

## 2023-02-06 DIAGNOSIS — O24419 Gestational diabetes mellitus in pregnancy, unspecified control: Secondary | ICD-10-CM | POA: Insufficient documentation

## 2023-02-11 NOTE — Progress Notes (Deleted)
[facility] Visit  Katherine Love is a 22 y.o. [phone] at [redacted]w[redacted]d (via ***) who presents to [facility] for routine follow up. Seen today along with resident Dr. Marland Kitchen Prenatal course, history, notes, ultrasounds, and laboratory results reviewed.  Denies cramping/ctx, fluid leaking, vaginal bleeding, or decreased fetal movement. Taking PNV.    Primary Prenatal Care Provider: ***  Postpartum Plans: - delivery planning: *** - circumcision: *** - feeding: *** - pediatrician: *** - contraception: ***  FHR: *** Uterine size: ***  Assessment & Plan  1. Routine prenatal care: - offered COVID vaccine and flu vaccine today - recommended baby aspirin - 3 hr GTT today, HIV, CBC, RPR  2. Rh negative- Antibody screen collected today, then Rhogam administered.  3. History of shoulder dystocia in previous pregnancy- discussed with patients today, 3420g. Discussed risk of recurrent shoulder dystocia with patient. Next growth [place] 02/18/23.   Next prenatal visit in 2 weeks . Labor & fetal movement precautions discussed.  Burley Saver, MD [facility] Family Medicine Faculty

## 2023-02-13 ENCOUNTER — Telehealth: Payer: Self-pay | Admitting: Family Medicine

## 2023-02-13 ENCOUNTER — Encounter: Payer: Medicaid Other | Admitting: Family Medicine

## 2023-02-13 ENCOUNTER — Encounter: Payer: Self-pay | Admitting: Family Medicine

## 2023-02-13 DIAGNOSIS — Z3A28 28 weeks gestation of pregnancy: Secondary | ICD-10-CM

## 2023-02-13 IMAGING — US US OB COMP LESS 14 WK
1 series · 15 of 24 positions shown · non-contrast
Comparison: None.

CLINICAL DATA: Abdominal pain

EXAM:
OBSTETRIC <14 WK ULTRASOUND
TECHNIQUE: Transabdominal ultrasound was performed for evaluation of the
gestation as well as the maternal uterus and adnexal regions.

[Series 1: us ob comp less 14 wk · 24 acquisitions, 15 frames shown]
[im 1/24]
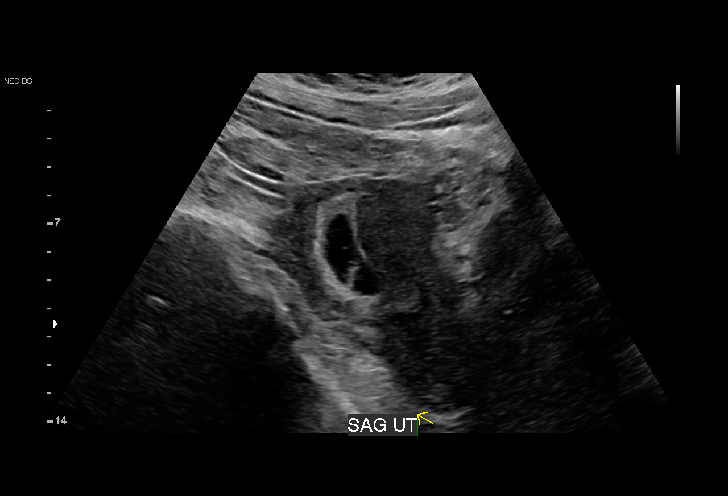
[im 3/24]
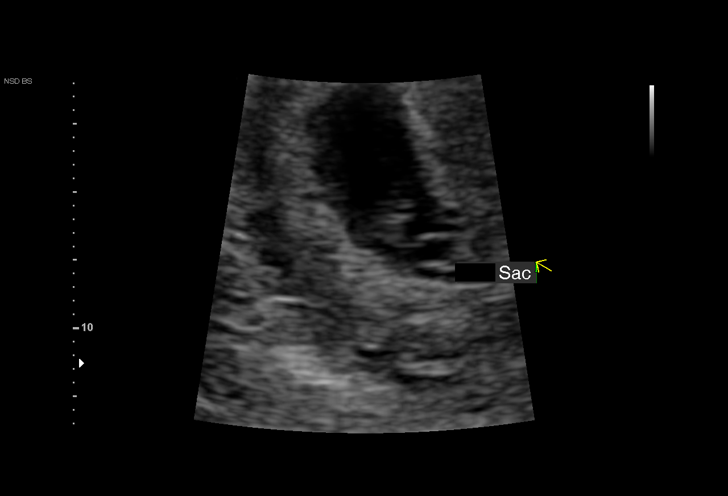
[im 5/24]
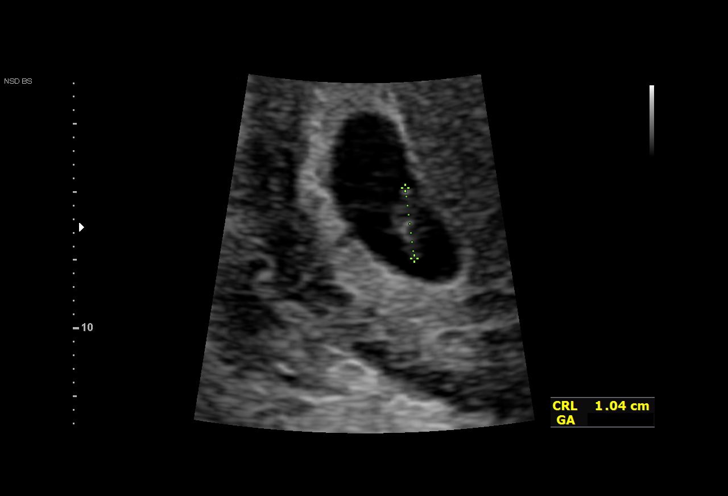
[im 6/24]
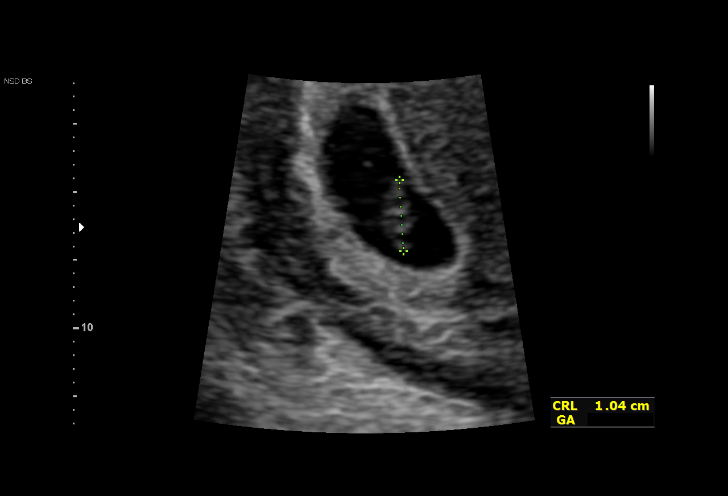
[im 8/24]
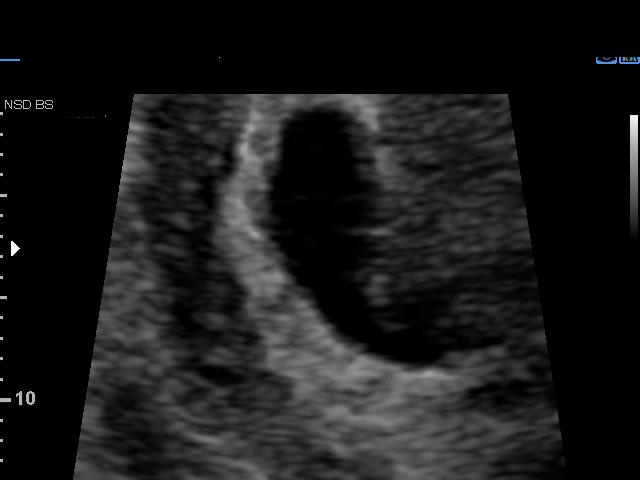
[im 9/24]
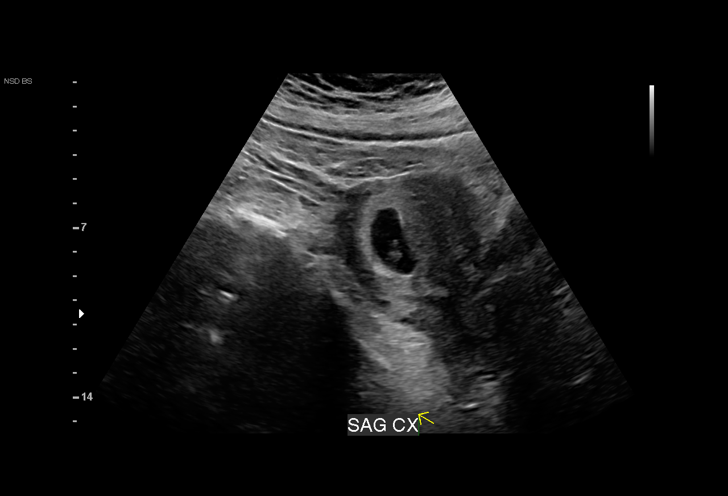
[im 11/24]
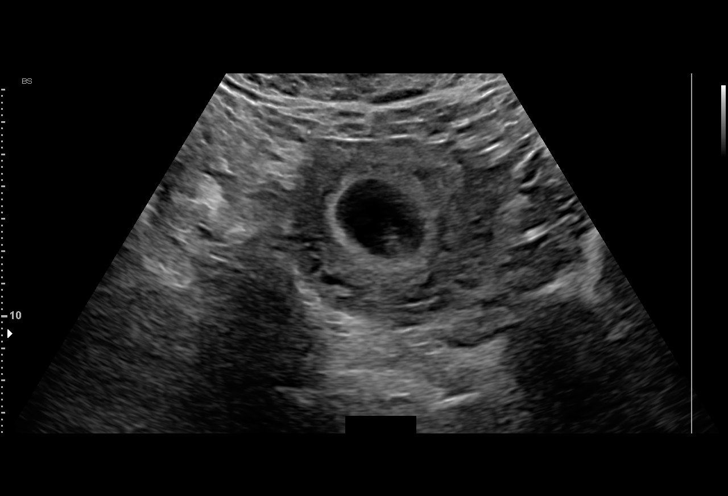
[im 13/24]
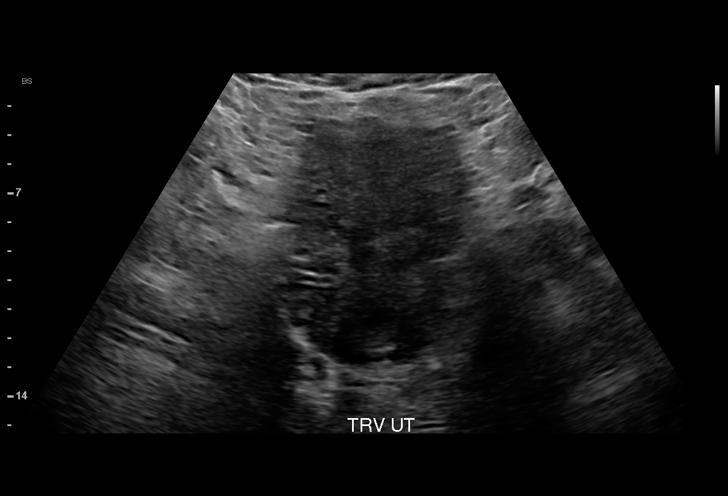
[im 14/24]
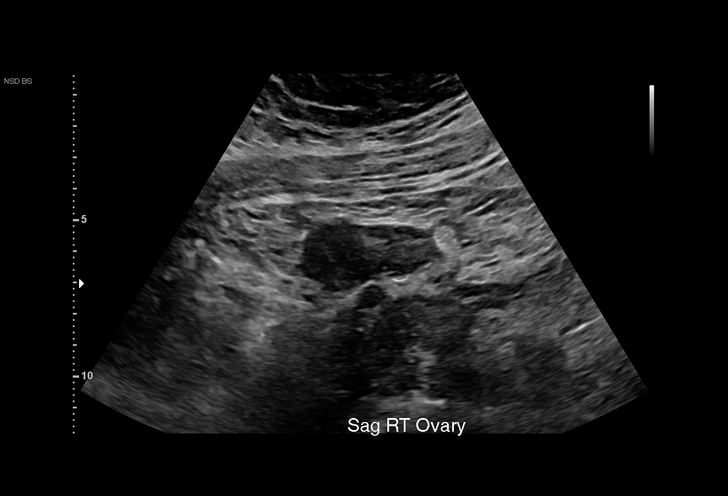
[im 16/24]
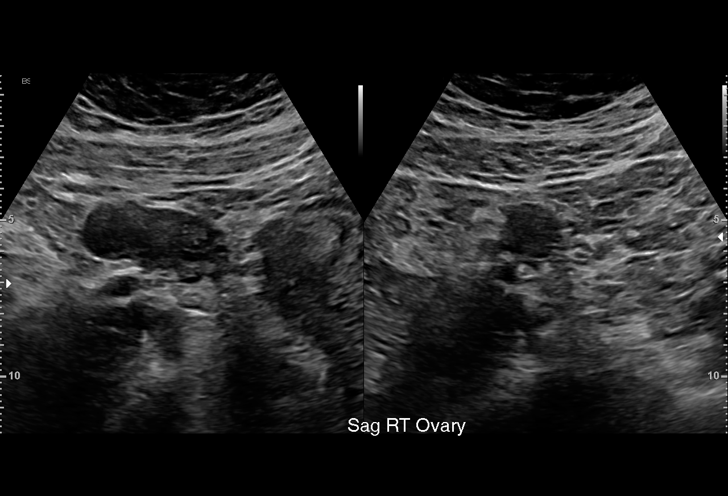
[im 17/24]
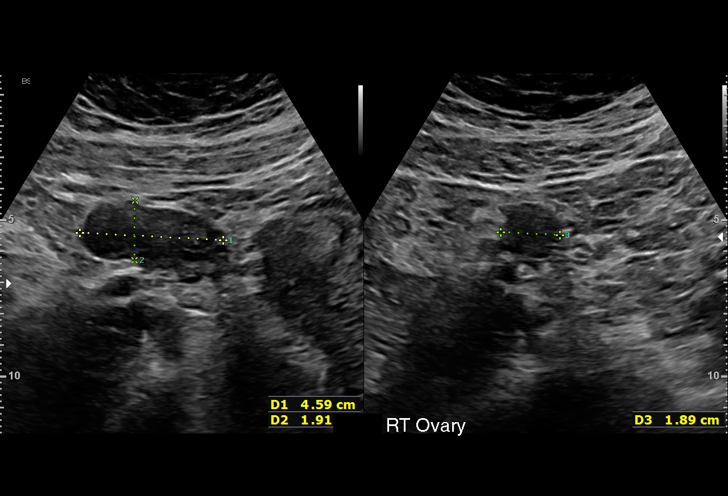
[im 19/24]
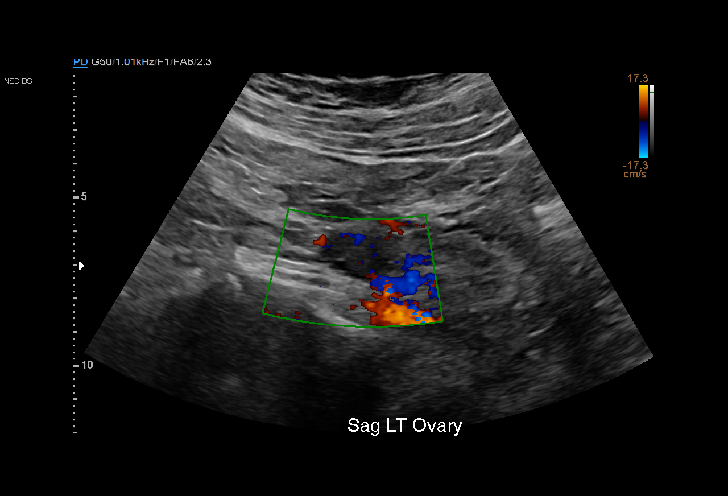
[im 21/24]
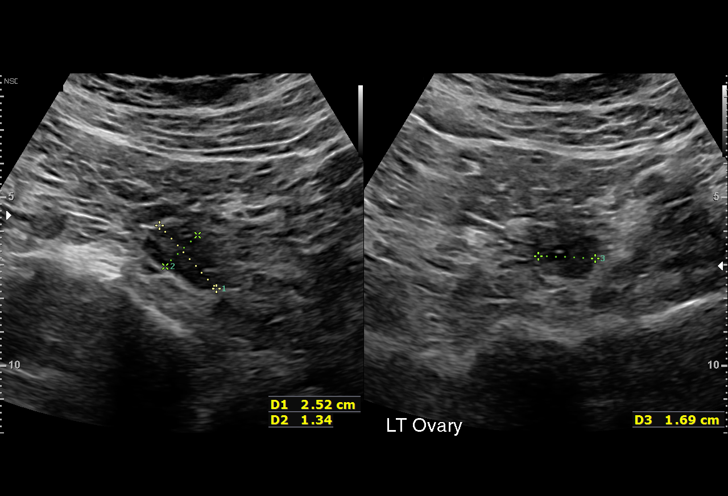
[im 22/24]
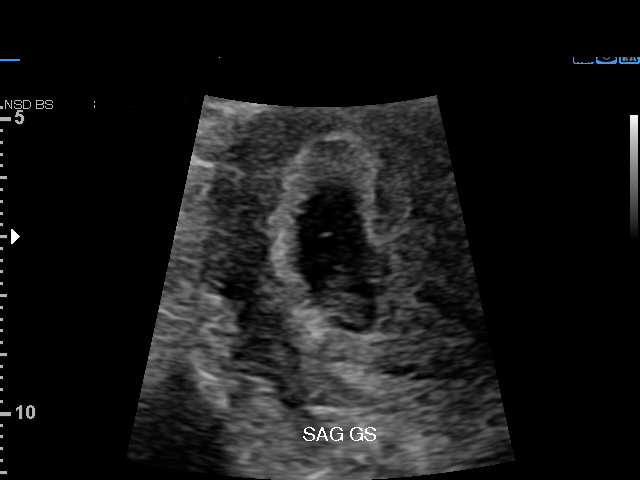
[im 24/24]
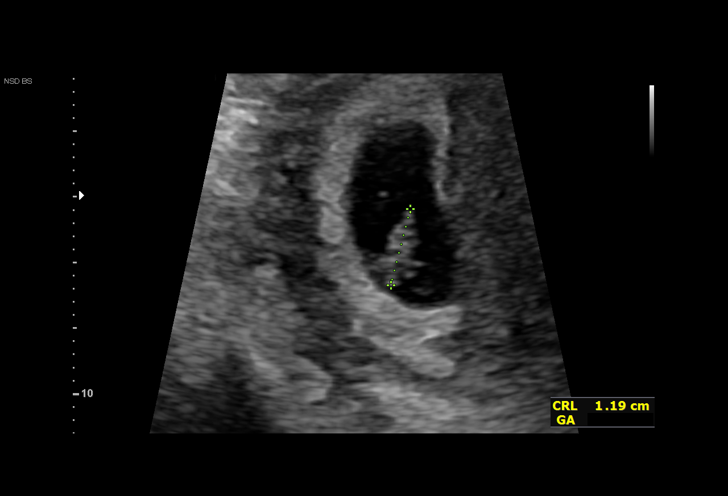

[15 of 24 positions shown; findings below may reference images not displayed]

FINDINGS: Intrauterine gestational sac: Single intrauterine pregnancy

Yolk sac:  Visualized

Embryo:  Visualized

Cardiac Activity: Visualized

Heart Rate: 157 bpm

CRL: 12.3 mm   7 w 3 d                  US EDC: 02/14/2021

Subchorionic hemorrhage:  None visualized.

Maternal uterus/adnexae: Ovaries are within normal limits. The right
ovary measures 4.6 x 1.9 x 1.9 cm. Left ovary measures 2.5 x 1.3 x
1.7 cm. Probable corpus luteum in the right ovary. No significant
free fluid.
IMPRESSION: Single viable intrauterine pregnancy as above. Otherwise no specific
abnormality is seen.

## 2023-02-13 NOTE — Telephone Encounter (Signed)
Late entry:  Called patient yesterday at 5:30 PM and left HIPAA compliant VM reminding of appt tomorrow and asked to come early and fasting to do sugar testing.   Called today to remind patient of appt and left VM asking to call back to reschedule if unable to make it.

## 2023-02-17 ENCOUNTER — Encounter: Payer: Self-pay | Admitting: Student

## 2023-02-17 NOTE — Telephone Encounter (Signed)
Called patient to discuss concerns and see about getting her to be evaluated at MAU. Patient did not answer and phone unable to leave VM.   Will attempt to call back tomorrow

## 2023-02-18 ENCOUNTER — Ambulatory Visit: Payer: Medicaid Other | Attending: Maternal & Fetal Medicine

## 2023-02-18 NOTE — Telephone Encounter (Signed)
Attempted to call patient as no MAU encounter in Epic. Unable to leave message.

## 2023-03-17 ENCOUNTER — Telehealth: Payer: Self-pay | Admitting: Family Medicine

## 2023-03-17 NOTE — Telephone Encounter (Signed)
Front team--patient has missed multiple follow ups. Can you please call her and see if she is still following with [place] for Ob care? Terisa Starr, MD  Family Medicine Teaching Service

## 2023-03-26 NOTE — L&D Delivery Note (Addendum)
OB/GYN Faculty Practice Delivery Note  Katherine Love is a 23 y.o. [id number] s/p SVD at [redacted]w[redacted]d. She was admitted for IOL for preE.   ROM: 3h 51m with clear fluid GBS Status: Negative/-- (01/16 1535) Maximum Maternal Temperature: Temp (24hrs), Avg:98.7 F (37.1 C), Min:98.5 F (36.9 C), Max:98.8 F (37.1 C)   Labor Progress: Initial SVE: 4/60/-3. AROM and Pitocin required. She then progressed to complete.   Delivery Date/Time: 04/17/23 @2212  Delivery: Called to room and patient was feeling pressure, still with anterior lip. Anterior lip able to be displaced with contraction. Head delivered OA to ROA. nuchal x1 present which was delivered through . The posterior (right) axilla was grasped with my index finger, and the baby was rotated clockwise into the oblique diameter.  At this point, the (now) anterior shoulder was released, and the baby delivered.  At no time was any traction placed on the baby's head. Infant with spontaneous cry, placed on mother's abdomen, dried and stimulated. Cord clamped x 2 after +1-minute delay, and cut by FOB. Cord gases not obtained. Cord blood drawn. Placenta delivered spontaneously with gentle cord traction. Fundus firm with massage and Pitocin. Labia, perineum, and vagina inspected with no lacerations. Mom and baby to postpartum. Baby Weight: pending  Placenta: 3 vessel, intact. Sent to L&D Complications: None Lacerations: no lacerations EBL: 127 mL Anesthesia: epidural  Infant: baby boy [name] APGAR (1 MIN):   APGAR (5 MINS):   APGAR (10 MINS):    Margie Billet, MD [facility] Family Medicine Fellow, [facility] for [facility], [facility] 04/17/2023, 10:31 PM   The above was performed under my direct supervision and guidance.

## 2023-03-31 ENCOUNTER — Telehealth: Payer: Self-pay

## 2023-03-31 NOTE — Telephone Encounter (Signed)
 Received call from [facility] hospital transitions nurse.   She reports the patient was hospitalized (see notes.)  She reports recommendations to see patient twice weekly testing and delivery at 37 weeks for preeclampsia without severe features.  Advised we have not seen the patient for [facility] care since October. Advised multiple attempts have been made to schedule patients OB visits. Discussed no shows.   I called patient and LVM to call back to discuss and schedule apt ASAP.

## 2023-04-01 NOTE — Telephone Encounter (Signed)
 Patient returns call to nurse line. She reports that she has not established care at another office, however, she has moved and is currently living in [place], [place].   Advised that we would need to schedule her an appointment for follow up and to determine next steps since her recent hospital visit.   Scheduled patient on Friday afternoon with Dr. Sowell.   Chiquita JAYSON English, RN

## 2023-04-02 NOTE — Telephone Encounter (Signed)
 Received returned call from [facility] regarding patient.   She reports that patient states that she would like to deliver at [facility]. She said that [facility] may be able to get patient in to establish and manage care prior to delivery.   I am including their information below.   Address: [address], [place], [place] [address] Phone: [phone]  She also reports that patient is heading to be evaluated today at local hospital due to decreased fetal movement.   Will forward message to PCP.   Chiquita JAYSON English, RN

## 2023-04-04 ENCOUNTER — Encounter: Payer: Medicaid Other | Admitting: Student

## 2023-04-08 ENCOUNTER — Encounter (HOSPITAL_COMMUNITY): Payer: Self-pay | Admitting: Obstetrics and Gynecology

## 2023-04-08 ENCOUNTER — Ambulatory Visit (INDEPENDENT_AMBULATORY_CARE_PROVIDER_SITE_OTHER): Payer: Medicaid Other | Admitting: Student

## 2023-04-08 ENCOUNTER — Inpatient Hospital Stay (HOSPITAL_COMMUNITY)
Admission: AD | Admit: 2023-04-08 | Discharge: 2023-04-08 | Disposition: A | Discharge status: Home / Self Care | Payer: Medicaid Other | Attending: Obstetrics and Gynecology | Admitting: Obstetrics and Gynecology

## 2023-04-08 VITALS — BP 125/85 | HR 118 | Wt 253.0 lb

## 2023-04-08 DIAGNOSIS — O99891 Other specified diseases and conditions complicating pregnancy: Secondary | ICD-10-CM | POA: Insufficient documentation

## 2023-04-08 DIAGNOSIS — O368130 Decreased fetal movements, third trimester, not applicable or unspecified: Secondary | ICD-10-CM | POA: Diagnosis present

## 2023-04-08 DIAGNOSIS — O368330 Maternal care for abnormalities of the fetal heart rate or rhythm, third trimester, not applicable or unspecified: Secondary | ICD-10-CM | POA: Diagnosis not present

## 2023-04-08 DIAGNOSIS — N390 Urinary tract infection, site not specified: Secondary | ICD-10-CM

## 2023-04-08 DIAGNOSIS — H8303 Labyrinthitis, bilateral: Secondary | ICD-10-CM | POA: Diagnosis not present

## 2023-04-08 DIAGNOSIS — R000 Tachycardia, unspecified: Secondary | ICD-10-CM | POA: Insufficient documentation

## 2023-04-08 DIAGNOSIS — O2343 Unspecified infection of urinary tract in pregnancy, third trimester: Secondary | ICD-10-CM | POA: Diagnosis not present

## 2023-04-08 DIAGNOSIS — Z3A35 35 weeks gestation of pregnancy: Secondary | ICD-10-CM | POA: Insufficient documentation

## 2023-04-08 LAB — CBC WITH DIFFERENTIAL/PLATELET
Abs Immature Granulocytes: 0.14 10*3/uL — ABNORMAL HIGH (ref 0.00–0.07)
Basophils Absolute: 0 10*3/uL (ref 0.0–0.1)
Basophils Relative: 0 %
Eosinophils Absolute: 0.1 10*3/uL (ref 0.0–0.5)
Eosinophils Relative: 0 %
HCT: 39.2 % (ref 36.0–46.0)
Hemoglobin: 12.9 g/dL (ref 12.0–15.0)
Immature Granulocytes: 1 %
Lymphocytes Relative: 12 %
Lymphs Abs: 1.8 10*3/uL (ref 0.7–4.0)
MCH: 29.3 pg (ref 26.0–34.0)
MCHC: 32.9 g/dL (ref 30.0–36.0)
MCV: 88.9 fL (ref 80.0–100.0)
Monocytes Absolute: 0.6 10*3/uL (ref 0.1–1.0)
Monocytes Relative: 4 %
Neutro Abs: 12.7 10*3/uL — ABNORMAL HIGH (ref 1.7–7.7)
Neutrophils Relative %: 83 %
Platelets: 494 10*3/uL — ABNORMAL HIGH (ref 150–400)
RBC: 4.41 MIL/uL (ref 3.87–5.11)
RDW: 13.3 % (ref 11.5–15.5)
WBC: 15.3 10*3/uL — ABNORMAL HIGH (ref 4.0–10.5)
nRBC: 0 % (ref 0.0–0.2)

## 2023-04-08 LAB — RESPIRATORY PANEL BY PCR

## 2023-04-08 LAB — URINALYSIS, ROUTINE W REFLEX MICROSCOPIC
Bilirubin Urine: NEGATIVE
Glucose, UA: NEGATIVE mg/dL
Hgb urine dipstick: NEGATIVE
Ketones, ur: NEGATIVE mg/dL
Nitrite: POSITIVE — AB
Protein, ur: 30 mg/dL — AB
Specific Gravity, Urine: 1.014 (ref 1.005–1.030)
WBC, UA: >50 WBC/hpf (ref 0–5)
pH: 6 (ref 5.0–8.0)

## 2023-04-08 LAB — PROTEIN / CREATININE RATIO, URINE
Creatinine, Urine: 118 mg/dL
Protein Creatinine Ratio: 0.42 mg/mg{creat} — ABNORMAL HIGH (ref 0.00–0.15)
Total Protein, Urine: 50 mg/dL

## 2023-04-08 LAB — COMPREHENSIVE METABOLIC PANEL
ALT: 16 U/L (ref 0–44)
AST: 19 U/L (ref 15–41)
Albumin: 2.8 g/dL — ABNORMAL LOW (ref 3.5–5.0)
Alkaline Phosphatase: 173 U/L — ABNORMAL HIGH (ref 38–126)
Anion gap: 14 (ref 5–15)
BUN: <5 mg/dL — ABNORMAL LOW (ref 6–20)
CO2: 20 mmol/L — ABNORMAL LOW (ref 22–32)
Calcium: 9.2 mg/dL (ref 8.9–10.3)
Chloride: 104 mmol/L (ref 98–111)
Creatinine, Ser: 0.51 mg/dL (ref 0.44–1.00)
GFR, Estimated: >60 mL/min (ref >60)
Glucose, Bld: 86 mg/dL (ref 70–99)
Potassium: 4.1 mmol/L (ref 3.5–5.1)
Sodium: 138 mmol/L (ref 135–145)
Total Bilirubin: 0.5 mg/dL (ref 0.0–1.2)
Total Protein: 7.5 g/dL (ref 6.5–8.1)

## 2023-04-08 LAB — WET PREP, GENITAL
Clue Cells Wet Prep HPF POC: NONE SEEN
Sperm: NONE SEEN
Trich, Wet Prep: NONE SEEN
WBC, Wet Prep HPF POC: <10 (ref <10)
Yeast Wet Prep HPF POC: NONE SEEN

## 2023-04-08 MED ORDER — CYCLOBENZAPRINE HCL 5 MG PO TABS: 10.0000 mg | ORAL_TABLET | Freq: Once | ORAL | Status: DC

## 2023-04-08 MED ORDER — CEFADROXIL 500 MG PO CAPS: 500.0000 mg | ORAL_CAPSULE | Freq: Two times a day (BID) | ORAL | 0 refills | Status: AC

## 2023-04-08 MED ORDER — SODIUM CHLORIDE 0.9 % IV SOLN
2.0000 g | Freq: Once | INTRAVENOUS | Status: DC
  Filled 2023-04-08: qty 20

## 2023-04-08 MED ORDER — ACETAMINOPHEN-CAFFEINE 500-65 MG PO TABS
2.0000 | ORAL_TABLET | Freq: Once | ORAL | Status: AC
Start: 2023-04-08 — End: 2023-04-08
  Administered 2023-04-08: 2 via ORAL
  Filled 2023-04-08: qty 2

## 2023-04-08 MED ORDER — ONDANSETRON HCL 4 MG PO TABS: 4.0000 mg | ORAL_TABLET | Freq: Three times a day (TID) | ORAL | 0 refills | Status: AC | PRN

## 2023-04-08 MED ORDER — LACTATED RINGERS IV BOLUS
1000.0000 mL | Freq: Once | INTRAVENOUS | Status: AC
  Administered 2023-04-08: 1000 mL via INTRAVENOUS

## 2023-04-08 MED ORDER — CEFTRIAXONE SODIUM 1 G IJ SOLR
1.0000 g | Freq: Once | INTRAMUSCULAR | Status: AC
Start: 2023-04-08 — End: 2023-04-08
  Administered 2023-04-08: 1 g via INTRAMUSCULAR
  Filled 2023-04-08: qty 10

## 2023-04-08 MED ORDER — CYCLOBENZAPRINE HCL 5 MG PO TABS: 5.0000 mg | ORAL_TABLET | Freq: Three times a day (TID) | ORAL | 0 refills | Status: AC | PRN

## 2023-04-08 MED ORDER — ONDANSETRON 4 MG PO TBDP
8.0000 mg | ORAL_TABLET | Freq: Once | ORAL | Status: AC
  Administered 2023-04-08: 8 mg via ORAL
  Filled 2023-04-08: qty 2

## 2023-04-08 MED ORDER — CYCLOBENZAPRINE HCL 5 MG PO TABS
10.0000 mg | ORAL_TABLET | Freq: Once | ORAL | Status: AC
  Administered 2023-04-08: 10 mg via ORAL
  Filled 2023-04-08: qty 2

## 2023-04-08 MED ORDER — LIDOCAINE HCL (PF) 1 % IJ SOLN
2.0000 mL | Freq: Once | INTRAMUSCULAR | Status: AC
Start: 2023-04-08 — End: 2023-04-08
  Administered 2023-04-08: 2 mL
  Filled 2023-04-08: qty 5

## 2023-04-08 NOTE — MAU Provider Note (Signed)
 Chief Complaint:  Tachycardia   HPI   None     Katherine Love is a 23 y.o. [id number] at [redacted]w[redacted]d who presents to maternity admissions following being seen by [facility] and directed here for tachycardia, DFM, and fetal tachycardia. She reports recent exposure to a respiratory virus from her children. She additionally reports back and abdominal pain. She denies vaginal bleeding or LOF.   Pregnancy Course: PEC without severe features, s/p cholecystectomy at 34 weeks  Past Medical History:  Diagnosis Date   Anxiety    Depression    OB History  Gravida Para Term Preterm AB Living  3 2 1 1  0 2  SAB IAB Ectopic Multiple Live Births  0 0 0 0 2    # Outcome Date GA Lbr Len/2nd Weight Sex Type Anes PTL Lv  3 Current           2 Term 02/07/21 [redacted]w[redacted]d 06:09 / 00:16 3420 g M Vag-Spont EPI  LIV  1 Preterm 12/08/18 [redacted]w[redacted]d   M Vag-Spont   LIV     Complications: History of preterm premature rupture of membranes (PPROM)   Past Surgical History:  Procedure Laterality Date   CHOLECYSTECTOMY     Family History  Problem Relation Age of Onset   Hypertension Mother    Social History   Tobacco Use   Smoking status: Never   Smokeless tobacco: Never  Vaping Use   Vaping status: Never Used  Substance Use Topics   Alcohol use: Not Currently   Drug use: Not Currently    Frequency: 1.0 times per week    Types: Marijuana    Comment: last use Dec 23 2022   No Known Allergies Medications Prior to Admission  Medication Sig Dispense Refill Last Dose/Taking   acetaminophen  (TYLENOL ) 500 MG tablet Take 500 mg by mouth every 6 (six) hours as needed.   04/08/2023 at  8:30 AM   Blood Pressure Monitoring (BLOOD PRESSURE MON/AUTO/WRIST) DEVI Take Blood Pressure Twice a Day 1 each 1    Prenatal Vit-Fe Fumarate-FA (MULTIVITAMIN-PRENATAL) 27-0.8 MG TABS tablet Take 1 tablet by mouth daily at 12 noon. 90 tablet 1 More than a month    I have reviewed patient's Past Medical Hx, Surgical Hx, Family Hx, Social Hx,  medications and allergies.   ROS  Pertinent items noted in HPI and remainder of comprehensive ROS otherwise negative.   PHYSICAL EXAM  Patient Vitals for the past 24 hrs:  BP Temp Temp src Pulse Resp SpO2 Height Weight  04/08/23 1832 138/80 -- -- (!) 113 -- -- -- --  04/08/23 1829 -- -- -- -- -- 99 % -- --  04/08/23 1824 -- -- -- -- -- 98 % -- --  04/08/23 1819 -- -- -- -- -- 99 % -- --  04/08/23 1814 -- -- -- -- -- 99 % -- --  04/08/23 1809 -- -- -- -- -- 98 % -- --  04/08/23 1804 -- -- -- -- -- 99 % -- --  04/08/23 1759 -- -- -- -- -- 100 % -- --  04/08/23 1754 -- -- -- -- -- 100 % -- --  04/08/23 1734 -- -- -- -- -- 97 % -- --  04/08/23 1730 119/69 -- -- (!) 117 -- -- -- --  04/08/23 1729 -- -- -- -- -- 98 % -- --  04/08/23 1724 -- -- -- -- -- 98 % -- --  04/08/23 1719 -- -- -- -- -- 98 % -- --  04/08/23 1715 107/62 -- -- (!) 126 -- -- -- --  04/08/23 1714 -- -- -- -- -- 98 % -- --  04/08/23 1709 -- -- -- -- -- 98 % -- --  04/08/23 1704 -- -- -- -- -- 98 % -- --  04/08/23 1700 139/81 -- -- (!) 119 -- -- -- --  04/08/23 1659 -- -- -- -- -- 96 % -- --  04/08/23 1654 -- -- -- -- -- 97 % -- --  04/08/23 1651 -- -- -- (!) 123 -- -- -- --  04/08/23 1649 -- -- -- -- -- 96 % -- --  04/08/23 1648 -- -- -- (!) 129 -- -- -- --  04/08/23 1644 -- -- -- -- -- 97 % -- --  04/08/23 1640 132/86 98.7 F (37.1 C) Oral (!) 130 19 98 % 5' 5 (1.651 m) 113.4 kg    Constitutional: Well-developed, well-nourished female in no acute distress. Ill appearing Cardiovascular: normal rate & rhythm, warm and well-perfused Respiratory: normal effort, no problems with respiration noted GI: Abd soft, non-tender, non-distended MS: Extremities nontender, no edema, normal ROM Neurologic: Alert and oriented x 4.  GU: Borderline CVA tenderness.  Dilation: 1.5 Effacement (%): 50 Cervical Position: Posterior Station: Ballotable Presentation: Vertex Exam by:: Camie Rote, CNM  Fetal  Tracing: Baseline: 125 Variability: mod  Accelerations: 15 x 15  Decelerations:  Toco: UI with occ UC   Labs: Results for orders placed or performed during the hospital encounter of 04/08/23 (from the past 24 hours)  Urinalysis, Routine w reflex microscopic -Urine, Clean Catch     Status: Abnormal   Collection Time: 04/08/23  4:58 PM  Result Value Ref Range   Color, Urine YELLOW YELLOW   APPearance CLOUDY (A) CLEAR   Specific Gravity, Urine 1.014 1.005 - 1.030   pH 6.0 5.0 - 8.0   Glucose, UA NEGATIVE NEGATIVE mg/dL   Hgb urine dipstick NEGATIVE NEGATIVE   Bilirubin Urine NEGATIVE NEGATIVE   Ketones, ur NEGATIVE NEGATIVE mg/dL   Protein, ur 30 (A) NEGATIVE mg/dL   Nitrite POSITIVE (A) NEGATIVE   Leukocytes,Ua LARGE (A) NEGATIVE   RBC / HPF 0-5 0 - 5 RBC/hpf   WBC, UA >50 0 - 5 WBC/hpf   Bacteria, UA MANY (A) NONE SEEN   Squamous Epithelial / HPF 0-5 0 - 5 /HPF   WBC Clumps PRESENT    Mucus PRESENT   Protein / creatinine ratio, urine     Status: Abnormal   Collection Time: 04/08/23  4:58 PM  Result Value Ref Range   Creatinine, Urine 118 mg/dL   Total Protein, Urine 50 mg/dL   Protein Creatinine Ratio 0.42 (H) 0.00 - 0.15 mg/mg[Cre]  CBC with Differential/Platelet     Status: Abnormal   Collection Time: 04/08/23  6:28 PM  Result Value Ref Range   WBC 15.3 (H) 4.0 - 10.5 K/uL   RBC 4.41 3.87 - 5.11 MIL/uL   Hemoglobin 12.9 12.0 - 15.0 g/dL   HCT 60.7 63.9 - 53.9 %   MCV 88.9 80.0 - 100.0 fL   MCH 29.3 26.0 - 34.0 pg   MCHC 32.9 30.0 - 36.0 g/dL   RDW 86.6 88.4 - 84.4 %   Platelets 494 (H) 150 - 400 K/uL   nRBC 0.0 0.0 - 0.2 %   Neutrophils Relative % 83 %   Neutro Abs 12.7 (H) 1.7 - 7.7 K/uL   Lymphocytes Relative 12 %   Lymphs Abs 1.8 0.7 - 4.0 K/uL  Monocytes Relative 4 %   Monocytes Absolute 0.6 0.1 - 1.0 K/uL   Eosinophils Relative 0 %   Eosinophils Absolute 0.1 0.0 - 0.5 K/uL   Basophils Relative 0 %   Basophils Absolute 0.0 0.0 - 0.1 K/uL   Immature  Granulocytes 1 %   Abs Immature Granulocytes 0.14 (H) 0.00 - 0.07 K/uL  Comprehensive metabolic panel     Status: Abnormal   Collection Time: 04/08/23  6:28 PM  Result Value Ref Range   Sodium 138 135 - 145 mmol/L   Potassium 4.1 3.5 - 5.1 mmol/L   Chloride 104 98 - 111 mmol/L   CO2 20 (L) 22 - 32 mmol/L   Glucose, Bld 86 70 - 99 mg/dL   BUN <5 (L) 6 - 20 mg/dL   Creatinine, Ser 9.48 0.44 - 1.00 mg/dL   Calcium 9.2 8.9 - 89.6 mg/dL   Total Protein 7.5 6.5 - 8.1 g/dL   Albumin 2.8 (L) 3.5 - 5.0 g/dL   AST 19 15 - 41 U/L   ALT 16 0 - 44 U/L   Alkaline Phosphatase 173 (H) 38 - 126 U/L   Total Bilirubin 0.5 0.0 - 1.2 mg/dL   GFR, Estimated >39 >39 mL/min   Anion gap 14 5 - 15  Wet prep, genital     Status: None   Collection Time: 04/08/23  7:36 PM  Result Value Ref Range   Yeast Wet Prep HPF POC NONE SEEN NONE SEEN   Trich, Wet Prep NONE SEEN NONE SEEN   Clue Cells Wet Prep HPF POC NONE SEEN NONE SEEN   WBC, Wet Prep HPF POC <10 <10   Sperm NONE SEEN     Imaging:  No results found.  MDM & MAU COURSE  MDM: Reviewed records UTI - prescription CBC, CMP, PCRatio (Trend PreE)  Respiratory panel for symptoms and exposure  MAU Course: Orders Placed This Encounter  Procedures   Respiratory (~20 pathogens) panel by PCR   Wet prep, genital   CBC with Differential/Platelet   Comprehensive metabolic panel   Urinalysis, Routine w reflex microscopic -Urine, Clean Catch   Protein / creatinine ratio, urine   Droplet precaution   Insert peripheral IV   Discharge patient   Meds ordered this encounter  Medications   ondansetron  (ZOFRAN -ODT) disintegrating tablet 8 mg   acetaminophen -caffeine  (EXCEDRIN TENSION HEADACHE) 500-65 MG per tablet 2 tablet   lactated ringers  bolus 1,000 mL   cefTRIAXone  (ROCEPHIN ) 2 g in sodium chloride  0.9 % 100 mL IVPB    Antibiotic Indication::   UTI   cefadroxil  (DURICEF) 500 MG capsule    Sig: Take 1 capsule (500 mg total) by mouth 2 (two) times  daily for 7 days.    Dispense:  14 capsule    Refill:  0    Supervising Provider:   PRATT, TANYA S [[id number]]   cyclobenzaprine  (FLEXERIL ) tablet 10 mg   ondansetron  (ZOFRAN ) 4 MG tablet    Sig: Take 1 tablet (4 mg total) by mouth every 8 (eight) hours as needed for nausea or vomiting.    Dispense:  20 tablet    Refill:  0    Supervising Provider:   PRATT, TANYA S [[id number]]   cyclobenzaprine  (FLEXERIL ) 5 MG tablet    Sig: Take 1 tablet (5 mg total) by mouth 3 (three) times daily as needed for muscle spasms.    Dispense:  20 tablet    Refill:  0    Supervising Provider:  PRATT, TANYA S [[id number]]    ASSESSMENT   1. Urinary tract infection without hematuria, site unspecified   2. Otitis interna, bilateral      PLAN  - IV antibiotic - one time dose here for UTI and otitis.  - Prescription sent to preferred pharmacy for duricef for dual treatment of UTI and otitis. Additional prescription sent for zofran . Zofran  and flexeril  sent for management of nausea and pain.  - Counseled on comfort measures and symptom management - Message sent to [facility] for an appointment to discuss mode of delivery.  Discharge home in stable condition with return precautions.     Allergies as of 04/08/2023   No Known Allergies      Medication List     TAKE these medications    acetaminophen  500 MG tablet Commonly known as: TYLENOL  Take 500 mg by mouth every 6 (six) hours as needed.   Blood Pressure Mon/Auto/Wrist Devi Take Blood Pressure Twice a Day   cefadroxil  500 MG capsule Commonly known as: DURICEF Take 1 capsule (500 mg total) by mouth 2 (two) times daily for 7 days.   cyclobenzaprine  5 MG tablet Commonly known as: FLEXERIL  Take 1 tablet (5 mg total) by mouth 3 (three) times daily as needed for muscle spasms.   multivitamin-prenatal 27-0.8 MG Tabs tablet Take 1 tablet by mouth daily at 12 noon.   ondansetron  4 MG tablet Commonly known as: Zofran  Take 1 tablet (4 mg total) by mouth every 8  (eight) hours as needed for nausea or vomiting.        Camie Rote, MSN, CNM, RNC-OB Certified Nurse Midwife, [facility] 04/08/2023 7:53 PM

## 2023-04-08 NOTE — Progress Notes (Signed)
  [facility] Prenatal Visit  Katherine Love is a 23 y.o. [id number] at [redacted]w[redacted]d for routine follow up. She reports poor fetal movement however this is normal for her. She denies loss of fluid. She was in the hospital [facility] in [place], [place]) 5 days ago but not overnight. Endorses trace vaginal bleeding 3 days ago which appeared like a clot. She endorses contractions with the last one being on the ride here. She is not taking aspirin or prenatal vitamin. She is not taking the prenatal vitamin because it makes her throw up. She reports nausea for the last 2 days and headache for the last 4 days. Tylenol  was not helpful for the headache. Her eyes hurt (behind the eyes) with her headache. Denies chest pain/pressure.  See flow sheet for details.  Vitals:   04/08/23 1510  BP: 125/85  Pulse: (!) 118  General: NAD CV: Tachycardic, normal rhythm Pulm: Bilateral wheezing, normal WOB Abdomen: Gravid abdomen, cholecystectomy scars well-healing  A/P: Pregnancy at [redacted]w[redacted]d.  Doing well.   Routine prenatal care:  Infant feeding choice: Not discussed Contraception choice: Not discussed Infant circumcision desired not discussed Tdapwas not given today. COVID vaccination was not discussed.  Abrysvo RSV vaccine was not discussed today. GBS/GC/CZ testing was not performed today.  2. Pregnancy issues include the following and were addressed as appropriate today:  Concern for fetal distress: Maternal and fetal tachycardia with recent history of passing clots, headache, nausea, contractions and poor fetal movement.  Poor prenatal care.  Additionally, she did not receive RhoGAM due to no-show history.  She is anemic according to last hemoglobin 10 2 weeks prior and is not taking prenatal vitamin nor iron supplement.  Advised promptly going to MAU for evaluation and consideration of receiving RhoGAM.  Further she has not received glucose tolerance test nor repeat infectious disease workup and is due for GBS  testing.  She will need follow-up with OB/GYN for irregular stress testing.  Problem list and pregnancy box updated: No.   Kieth Johnson, DO

## 2023-04-08 NOTE — Discharge Instructions (Addendum)
 You were seen tonight in MAU for a urinary tract infection and an ear infection. You were given an antibiotic here and a prescription to pick up for antibiotics at home. It is important to complete all these medications. I have also sent a message to your doctor to get you scheduled for an appointment to discuss a planned cesarean birth. I will message you with any respiratory virus results that come back that you need treatment for through MyChart. Otherwise, manage your symptoms with the medications below based on the symptoms you are experiencing.  I hope you feel better soon.   Thank you for letting us  care for you, Camie, Midwife  Safe Medications in Pregnancy   Acne:  Benzoyl Peroxide  Salicylic Acid   Backache/Headache:  Tylenol : 2 regular strength every 4 hours OR               2 Extra strength every 6 hours   Colds/Coughs/Allergies:  Benadryl  (alcohol free) 25 mg every 6 hours as needed  Breath right strips  Claritin  Cepacol throat lozenges  Chloraseptic throat spray  Cold-Eeze- up to three times per day  Cough drops, alcohol free  Flonase (by prescription only)  Guaifenesin  Mucinex  Robitussin DM (plain only, alcohol free)  Saline nasal spray/drops  Sudafed (pseudoephedrine) & Actifed * use only after [redacted] weeks gestation and if you do not have high blood pressure  Tylenol   Vicks Vaporub  Zinc lozenges  Zyrtec   Constipation:  Colace  Ducolax suppositories  Fleet enema  Glycerin  suppositories  Metamucil  Milk of magnesia  Miralax  Senokot  Smooth move tea   Diarrhea:  Kaopectate  Imodium A-D   *NO pepto Bismol   Hemorrhoids:  Anusol  Anusol HC  Preparation H  Tucks   Indigestion:  Tums  Maalox  Mylanta  Zantac  Pepcid   Insomnia:  Benadryl  (alcohol free) 25mg  every 6 hours as needed  Tylenol  PM  Unisom , no Gelcaps   Leg Cramps:  Tums  MagGel   Nausea/Vomiting:  Bonine  Dramamine  Emetrol  Ginger extract  Sea bands  Meclizine   Nausea medication to take during pregnancy:  Unisom  (doxylamine  succinate 25 mg tablets) Take one tablet daily at bedtime. If symptoms are not adequately controlled, the dose can be increased to a maximum recommended dose of two tablets daily (1/2 tablet in the morning, 1/2 tablet mid-afternoon and one at bedtime).  Vitamin B6 100mg  tablets. Take one tablet twice a day (up to 200 mg per day).   Skin Rashes:  Aveeno products  Benadryl  cream or 25mg  every 6 hours as needed  Calamine Lotion  1% cortisone cream   Yeast infection:  Gyne-lotrimin 7  Monistat 7    **If taking multiple medications, please check labels to avoid duplicating the same active ingredients  **take medication as directed on the label  ** Do not exceed 4000 mg of tylenol  in 24 hours  **Do not take medications that contain aspirin or ibuprofen 

## 2023-04-08 NOTE — MAU Note (Signed)
,.  Katherine Love is a 23 y.o. at [redacted]w[redacted]d here in MAU reporting: she was in the office today and they told her the baby had a high heart rate and also that the pt's heartrate was high. Reports back pain since last pm and a headache x 4 days. Also reports nasal congestion and a fullness in ears bilaterally. Reports decreased fetal movement for weeks. Denies vaginal bleeding.   Onset of complaint:today Pain score: 7/10 lower back Vitals:   04/08/23 1644 04/08/23 1648  BP:    Pulse:  (!) 129  Resp:    Temp:    SpO2: 97%      FHT:154 Lab orders placed from triage: ua

## 2023-04-09 ENCOUNTER — Telehealth: Payer: Self-pay

## 2023-04-09 ENCOUNTER — Telehealth: Payer: Self-pay | Admitting: Family Medicine

## 2023-04-09 LAB — GC/CHLAMYDIA PROBE AMP (~~LOC~~) NOT AT ARMC
Chlamydia: NEGATIVE
Comment: NEGATIVE
Comment: NORMAL
Neisseria Gonorrhea: NEGATIVE

## 2023-04-09 NOTE — Telephone Encounter (Signed)
 Pt called see phone note, scheduled in access to care tomorrow.

## 2023-04-09 NOTE — Telephone Encounter (Signed)
 Patient calls nurse line regarding follow up from yesterday's prenatal visit.   She reports that she was told by MAU to contact our office in regards to scheduling C-section next week.   She states that due to pre-eclampsia, she is supposed to deliver at 37 weeks.   Unsure if this was discussed at yesterday's visit due to patient needing MAU evaluation.   Will forward to Dr. Janae Mclean.   Patient reports that her cell phone is not working and would like to be called at her husband's number: [phone].  Updated number in chart.   Elsie Halo, RN

## 2023-04-09 NOTE — Telephone Encounter (Signed)
 Called patient to follow up from MAU visit yesterday.  She notes she is having intermittent contractions, still not feeling baby move (only felt one time today), no vaginal bleeding since MAU visit, no leaking fluid. She has no dysuria or fevers or chills, taking antibiotic from the MAU yesterday.   I discussed due to decreased fetal movement I do recommend NST/fetal evaluation today, when asked if it could be tomorrow I discussed risks of fetal distress/stillbirth and with her history of pre-E with severe features discussed importance of close fetal monitoring and she was in agreement to come in for fetal monitoring either at [facility] or at hospital by her house [facility].   Made appt in ATC tomorrow for prenatal visit: Recommendations for this visit: - Antibody screen then give Rhogam - GBS swab (already had GC/Chlamydia done at MAU yesterday) - Monitor BP, ask for signs/sx of pre-E (normal labs yesterday in MAU) - discuss with [facility] and arrange f/u this week if possible, as well as next NST prior to patient leaving  Reached out to Dr Daisey Dryer with [facility] who is working on arranging appt with [facility] and will get back to us  for date/time of appt as pt has pre-E without severe features and recommended delivery is at 37 WGA. Also asking about arranging next NST.   Discussed with patient she had history of shoulder dystocia with her last child in 2022, per chart review infant weighed 3420 g and shoulder dystocia resolved with McRoberts, suprapubic pressure, and delivery of posterior arm. She notes no neurologic defects with that child, but that she has discussed with providers previously the risk of recurrence and she would like to elect for a primary LTCS due to history of this. I discussed we are working on appt with high risk OB to discuss this as our group does not do Cesarean sections but they would be able to counsel her on this. Discussed preterm labor precautions.  Discussed strict return precautions including  HA, vision changes, chest pain/shortness of breath, RUQ pain, contractions every 5-10 min lasting > 1 hr as a sign of preterm labor, leakage of fluid, or decreased fetal movement that these would be reasons to go to hospital. Again reiterated evaluation today for decreased fetal movement and she was in agreement.   Avanell Bob MD

## 2023-04-10 ENCOUNTER — Other Ambulatory Visit: Payer: Self-pay | Admitting: Family Medicine

## 2023-04-10 ENCOUNTER — Other Ambulatory Visit: Payer: Self-pay | Admitting: *Deleted

## 2023-04-10 ENCOUNTER — Ambulatory Visit: Payer: Self-pay | Admitting: Student

## 2023-04-10 ENCOUNTER — Ambulatory Visit: Payer: Medicaid Other

## 2023-04-10 ENCOUNTER — Telehealth: Payer: Self-pay | Admitting: Family Medicine

## 2023-04-10 VITALS — BP 121/78 | HR 108 | Temp 97.5°F | Wt 252.8 lb

## 2023-04-10 DIAGNOSIS — Z3A36 36 weeks gestation of pregnancy: Secondary | ICD-10-CM

## 2023-04-10 DIAGNOSIS — Z3483 Encounter for supervision of other normal pregnancy, third trimester: Secondary | ICD-10-CM | POA: Diagnosis present

## 2023-04-10 DIAGNOSIS — Z23 Encounter for immunization: Secondary | ICD-10-CM

## 2023-04-10 DIAGNOSIS — O163 Unspecified maternal hypertension, third trimester: Secondary | ICD-10-CM

## 2023-04-10 DIAGNOSIS — O1494 Unspecified pre-eclampsia, complicating childbirth: Secondary | ICD-10-CM

## 2023-04-10 LAB — POCT 1 HR PRENATAL GLUCOSE: Glucose 1 Hr Prenatal, POC: 143 mg/dL

## 2023-04-10 MED ORDER — RHO D IMMUNE GLOBULIN 1500 UNIT/2ML IJ SOSY
300.0000 ug | PREFILLED_SYRINGE | Freq: Once | INTRAMUSCULAR | Status: AC
  Administered 2023-04-10: 300 ug via INTRAMUSCULAR

## 2023-04-10 NOTE — Addendum Note (Signed)
Addended by: Darnelle Spangle B on: 04/10/2023 03:27 PM   Modules accepted: Orders

## 2023-04-10 NOTE — Telephone Encounter (Signed)
Called [facility] MFM office- they have opening on Monday 04/14/23 for BPP at 2:15 pm, scheduled with them, no sooner openings for BPP. She also suggested that I check with [facility] and transferred to [facility] to check with scheduling.   Chart reviewed- she did have [place] at [facility] on 03/24/23 with EFW 51%ile, normal anatomy. No IUGR.  Called [facility] to discuss BPP this week- they can do today 04/10/23 at 1:45 PM. Scheduled to hold spot. Called patient and she is able to do this appointment, will plan to go to [facility] for BPP right after this. Pt notes an episode of vomiting this AM, feeling better now, but denies fevers, chills, vaginal bleeding, LOF, worsening flank pain. Notes feeling baby kick this AM and did not go for evaluation yesterday PM. Discussed to head in now to [facility] for her appt to be evaluated and then plan for BPP after which she was agreeable to.   Patient has clinic appt later today, per discussion with Dr Alvester Morin plan is:  1) Discuss and counsel on mode of delivery- with her history of shoulder dystocia risk of recurrence is 10% and C-section has risks as well, if desiring C-section will attempt to get virtual consult with provider at [facility] to counsel/schedule for next week 2) PEC- coordinating antenatal testing at [facility], greatly appreciate Dr Alvester Morin and [facility] team care, scheduled for BPP  on 04/10/23 today at 1:45 pm with [facility], will coordinate from there, had reactive NST per OB in MAU on 04/08/23, continue to counsel patient on signs/symptoms of severe features, will get repeat CBC/CMP for lab monitoring, if severe range BP or symptoms of pre-E recommended MAU evaluation, or if vaginal bleeding or decreased fetal movement would recommend MAU evaluation today, has follow up next week 04/17/23 with [facility] and recommended delivery is between 37-37.6 WGA as long as no development of severe features 3) Infectious symptoms- given Rocephin x1 and started on cefdinir for UTI symptoms, check for signs/symptoms of worsening  infection/pyelo or maternal/fetal tachycardia, also assess vomiting as she had recent cholecystectomy at [facility] 4) Routine prenatal care- GBS swab today, antibody screen THEN rhogam today, 1 hr GTT today if pt willing  Discussed with resident and preceptors seeing her today.   Burley Saver MD

## 2023-04-10 NOTE — Addendum Note (Signed)
Addended by: Jennette Bill on: 04/10/2023 01:40 PM   Modules accepted: Orders

## 2023-04-10 NOTE — Patient Instructions (Addendum)
Cheryll Cockayne,  I'm glad we've gotten you scheduled for a BPP today and again on the 20th. I'm also glad you are feeling better. Keep an eye on baby's movement and go to the MAU if you feel like he's not moving as much or you have recurrence in your bleeding. We do not need to see you back in our office since you will have follow-up with the OB/Gyns who can schedule your C-section.   Prenatal Classes Go to [email or web address] for more information on the pregnancy and child birth classes that [place] has to offer.   Vaccinations If you are with the Adopt a Mom Program and need vaccines, these are done the [facility] Department on [address] [place]. The number to call to schedule an appt is [phone]  Pregnancy Related Return Precautions The follow are signs/symptoms that are abnormal in pregnancy and may require further evaluation by a physician: Go to the MAU at [facility] at [facility] if: You have cramping/contractions that do not go away with drinking water, especially if they are lasting 30 seconds to 1.5 minutes, coming and going every 5-10 minutes for an hour or more, or are getting stronger and you cannot walk or talk while having a contraction/cramp. Your water breaks.  Sometimes it is a big gush of fluid, sometimes it is just a trickle that keeps getting your underwear wet or running down your legs You have vaginal bleeding.    You do not feel your baby moving like normal.  If you do not, get something to eat and drink (something cold or something with sugar like peanut butter or juice) and lay down and focus on feeling your baby move. If your baby is still not moving like normal, you should go to MAU. You should feel your baby move 6 times in one hour, or 10 times in two hours. You have a persistent headache that does not go away with 1 g of Tylenol, vision changes, chest pain, difficulty breathing, severe pain in your  right upper abdomen, worsening leg swelling- these can all be signs of high blood pressure in pregnancy and need to be evaluated by a provider immediately  These are all concerning in pregnancy and if you have any of these I recommend you call your PCP and present to the Maternity Admissions Unit (map below) for further evaluation.  For any pregnancy-related emergencies, please go to the Maternity Admissions Unit in the [facility] Center at [facility]. You will use hospital Entrance C.    Our clinic number is [phone].     Eliezer Mccoy, MD

## 2023-04-10 NOTE — Progress Notes (Addendum)
  [facility] Prenatal Visit  Katherine [facility] is a 23 y.o. [phone] at [redacted]w[redacted]d here for routine follow up. She is dated by early ultrasound.  She was seen in our clinic and subsequently in the MAU two days ago for fetal and maternal tachycardia and decreased fetal movement. In the MAU she was diagnosed with both a UTI and AOM and treatment was initiated with abx.  Of note, she was diagnosed with PEC w/o severe features at 34 weeks at [facility] when she was admitted for cholecystitis, now s/p cholecystectomy.  Today she is feeling well. She is not having any more vaginal bleeding or discharge. She is feeling baby kick this morning. Denies any headache or vision changes.  Her care has been complicated by multiple missed follow-ups and fragmented care. She has seen docs here and also with [facility] and at [facility] in [place] where she lives.   Vitals:   04/10/23 1125  BP: 121/78  Pulse: (!) 108  Temp: (!) 97.5 F (36.4 C)    A/P: Pregnancy at [redacted]w[redacted]d.  Doing well.   Routine prenatal care  Dating reviewed, dating tab is correct Fetal heart tones Appropriate 133 Fundal height within expected range.  37cm Fetal position confirmed Vertex using Ultrasound .  GBS collected today. .  Repeat GC/CT not collected today due to collected two days ago in MAU.  The patient does not have a history of HSV and valacyclovir is not indicated at this time.  Infant feeding choice: Breastfeeding Contraception choice: Tubal ligation, forms to be signed at 32 weeks. She tells me that these were signed, though ? If this may have been at [facility]? I cannot readily find this in her chart.  Infant circumcision desired yes Influenza vaccine previously administered.   Tdap administered today.  Abrysvo RSV vaccine NOT administered today as she will need to be delivered in the 37th week of pregnancy due to [place] [facility], so it would not have time to become effective.  Pregnancy education regarding preterm labor, fetal  movement,  benefits of breastfeeding, contraception, fetal growth, expected weight gain, and safe infant sleep were discussed.    2. Pregnancy issues include the following and were addressed as appropriate today:   PEC w/o severe features - diagnosed at 34 weeks at [facility]. BPP scheduled this afternoon at [facility] [place]. Has f/u BPP on 1/20. BP WNL today.  Repeat labs today for monitoring.   History of prior shoulder dystocia. 10% risk of recurrence was discussed today and she requests C-section. Will arrange this with [facility].   UTI, received Rocephin x1 in MAU and started on cefdinir. Has not actually picked up cefdinir yet. Advised to do so. Thankfully she is feeling better and maternal and fetal tachycardia noted on 1/14 seem improved at this time.   Routine PNC: GBS swab collected, Ab screen collected PRIOR to rhogam administration. Tdap administered today.   Problem list and pregnancy box updated: Yes.  Follow up 1 week with OB/Gyn for C-section scheduling. Many thanks to Drs. Pray and Newton for their assistance in getting patient scheduled with [facility].   Eliezer Mccoy, MD

## 2023-04-11 ENCOUNTER — Encounter: Payer: Self-pay | Admitting: Student

## 2023-04-11 LAB — COMPREHENSIVE METABOLIC PANEL
ALT: 11 [IU]/L (ref 0–32)
AST: 12 [IU]/L (ref 0–40)
Albumin: 3.7 g/dL — ABNORMAL LOW (ref 4.0–5.0)
Alkaline Phosphatase: 220 [IU]/L — ABNORMAL HIGH (ref 44–121)
BUN/Creatinine Ratio: 14 (ref 9–23)
BUN: 6 mg/dL (ref 6–20)
Bilirubin Total: 0.3 mg/dL (ref 0.0–1.2)
CO2: 20 mmol/L (ref 20–29)
Calcium: 9.5 mg/dL (ref 8.7–10.2)
Chloride: 102 mmol/L (ref 96–106)
Creatinine, Ser: 0.43 mg/dL — ABNORMAL LOW (ref 0.57–1.00)
Globulin, Total: 2.7 g/dL (ref 1.5–4.5)
Glucose: 129 mg/dL — ABNORMAL HIGH (ref 70–99)
Potassium: 4.3 mmol/L (ref 3.5–5.2)
Sodium: 138 mmol/L (ref 134–144)
Total Protein: 6.4 g/dL (ref 6.0–8.5)
eGFR: 139 mL/min/{1.73_m2} (ref >59)

## 2023-04-11 LAB — ANTIBODY SCREEN: Antibody Screen: NEGATIVE

## 2023-04-11 LAB — CBC
Hematocrit: 33.4 % — ABNORMAL LOW (ref 34.0–46.6)
Hemoglobin: 10.8 g/dL — ABNORMAL LOW (ref 11.1–15.9)
MCH: 29 pg (ref 26.6–33.0)
MCHC: 32.3 g/dL (ref 31.5–35.7)
MCV: 90 fL (ref 79–97)
Platelets: 429 10*3/uL (ref 150–450)
RBC: 3.73 x10E6/uL — ABNORMAL LOW (ref 3.77–5.28)
RDW: 12.8 % (ref 11.7–15.4)
WBC: 9.1 10*3/uL (ref 3.4–10.8)

## 2023-04-14 ENCOUNTER — Other Ambulatory Visit: Payer: Medicaid Other

## 2023-04-14 ENCOUNTER — Ambulatory Visit: Payer: Medicaid Other | Attending: Obstetrics | Admitting: Obstetrics

## 2023-04-14 ENCOUNTER — Ambulatory Visit: Payer: Medicaid Other | Attending: Maternal & Fetal Medicine

## 2023-04-14 ENCOUNTER — Other Ambulatory Visit: Payer: Self-pay

## 2023-04-14 ENCOUNTER — Ambulatory Visit: Payer: Medicaid Other

## 2023-04-14 DIAGNOSIS — O35BXX0 Maternal care for other (suspected) fetal abnormality and damage, fetal cardiac anomalies, not applicable or unspecified: Secondary | ICD-10-CM | POA: Diagnosis not present

## 2023-04-14 DIAGNOSIS — O09213 Supervision of pregnancy with history of pre-term labor, third trimester: Secondary | ICD-10-CM

## 2023-04-14 DIAGNOSIS — O99213 Obesity complicating pregnancy, third trimester: Secondary | ICD-10-CM | POA: Diagnosis present

## 2023-04-14 DIAGNOSIS — O133 Gestational [pregnancy-induced] hypertension without significant proteinuria, third trimester: Secondary | ICD-10-CM

## 2023-04-14 DIAGNOSIS — O360130 Maternal care for anti-D [Rh] antibodies, third trimester, not applicable or unspecified: Secondary | ICD-10-CM

## 2023-04-14 DIAGNOSIS — O1493 Unspecified pre-eclampsia, third trimester: Secondary | ICD-10-CM

## 2023-04-14 DIAGNOSIS — O09293 Supervision of pregnancy with other poor reproductive or obstetric history, third trimester: Secondary | ICD-10-CM | POA: Insufficient documentation

## 2023-04-14 DIAGNOSIS — Z3A36 36 weeks gestation of pregnancy: Secondary | ICD-10-CM | POA: Diagnosis not present

## 2023-04-14 DIAGNOSIS — O1494 Unspecified pre-eclampsia, complicating childbirth: Secondary | ICD-10-CM | POA: Diagnosis present

## 2023-04-14 DIAGNOSIS — O1403 Mild to moderate pre-eclampsia, third trimester: Secondary | ICD-10-CM | POA: Diagnosis not present

## 2023-04-14 DIAGNOSIS — E669 Obesity, unspecified: Secondary | ICD-10-CM

## 2023-04-14 LAB — CULTURE, BETA STREP (GROUP B ONLY): Strep Gp B Culture: NEGATIVE

## 2023-04-14 NOTE — Progress Notes (Signed)
MFM Consult Note  Katherine Love is currently at 36 weeks and 5 days.  She was seen for a BPP and growth scan due to recently diagnosed preeclampsia.  Her blood pressure today was 134/81.  The patient was seen at [facility] 1 month ago during which time she had a cholecystectomy and was diagnosed with preeclampsia.  She reports that she received a complete course of antenatal corticosteroids prior to her cholecystectomy.    Her PIH labs have been within normal limits.  She had a P/C ratio last week that was elevated (0.42).  She denies any signs or symptoms of preeclampsia.  Her last pregnancy was complicated by shoulder dystocia during the delivery of a 7-1/2 pound infant at term.  Due to her history of shoulder dystocia, she is requesting a cesarean delivery.  On today's exam, the overall EFW was 6 pounds 2 ounces (30th percentile for her gestational age).  There was normal amniotic fluid noted.  A BPP performed today was 8 out of 8.    Due to preeclampsia, delivery is recommended at between 37 to 38 weeks (within the next week).    The patient will discuss scheduling an C-section with you during her prenatal appointment later this week.   Preeclampsia precautions were reviewed today.  No further exams were scheduled in our office.    The patient stated that all of her questions were answered today.  A total of 20 minutes was spent counseling and coordinating the care for this patient.  Greater than 50% of the time was spent in direct face-to-face contact.

## 2023-04-17 ENCOUNTER — Encounter (HOSPITAL_COMMUNITY): Payer: Self-pay | Admitting: Obstetrics and Gynecology

## 2023-04-17 ENCOUNTER — Other Ambulatory Visit: Payer: Self-pay

## 2023-04-17 ENCOUNTER — Encounter: Payer: Self-pay | Admitting: Family Medicine

## 2023-04-17 ENCOUNTER — Other Ambulatory Visit: Payer: Medicaid Other

## 2023-04-17 ENCOUNTER — Telehealth: Payer: Medicaid Other | Admitting: Family Medicine

## 2023-04-17 ENCOUNTER — Inpatient Hospital Stay (HOSPITAL_COMMUNITY): Payer: Medicaid Other | Admitting: Anesthesiology

## 2023-04-17 ENCOUNTER — Inpatient Hospital Stay (HOSPITAL_COMMUNITY)
Admission: AD | Admit: 2023-04-17 | Discharge: 2023-04-19 | DRG: 807 | Disposition: A | Discharge status: Home / Self Care | Payer: Medicaid Other | Attending: Obstetrics and Gynecology | Admitting: Obstetrics and Gynecology

## 2023-04-17 DIAGNOSIS — O1404 Mild to moderate pre-eclampsia, complicating childbirth: Secondary | ICD-10-CM | POA: Diagnosis not present

## 2023-04-17 DIAGNOSIS — O26893 Other specified pregnancy related conditions, third trimester: Secondary | ICD-10-CM | POA: Diagnosis present

## 2023-04-17 DIAGNOSIS — O1493 Unspecified pre-eclampsia, third trimester: Secondary | ICD-10-CM | POA: Diagnosis not present

## 2023-04-17 DIAGNOSIS — O1400 Mild to moderate pre-eclampsia, unspecified trimester: Secondary | ICD-10-CM | POA: Diagnosis present

## 2023-04-17 DIAGNOSIS — Z8249 Family history of ischemic heart disease and other diseases of the circulatory system: Secondary | ICD-10-CM | POA: Diagnosis not present

## 2023-04-17 DIAGNOSIS — O360130 Maternal care for anti-D [Rh] antibodies, third trimester, not applicable or unspecified: Secondary | ICD-10-CM

## 2023-04-17 DIAGNOSIS — O09893 Supervision of other high risk pregnancies, third trimester: Secondary | ICD-10-CM | POA: Diagnosis not present

## 2023-04-17 DIAGNOSIS — Z3A37 37 weeks gestation of pregnancy: Secondary | ICD-10-CM | POA: Diagnosis not present

## 2023-04-17 DIAGNOSIS — O9902 Anemia complicating childbirth: Secondary | ICD-10-CM | POA: Diagnosis present

## 2023-04-17 DIAGNOSIS — Z6791 Unspecified blood type, Rh negative: Secondary | ICD-10-CM

## 2023-04-17 DIAGNOSIS — E66813 Obesity, class 3: Secondary | ICD-10-CM | POA: Diagnosis present

## 2023-04-17 DIAGNOSIS — O1494 Unspecified pre-eclampsia, complicating childbirth: Secondary | ICD-10-CM | POA: Diagnosis present

## 2023-04-17 DIAGNOSIS — O99214 Obesity complicating childbirth: Secondary | ICD-10-CM | POA: Diagnosis present

## 2023-04-17 DIAGNOSIS — Z3490 Encounter for supervision of normal pregnancy, unspecified, unspecified trimester: Principal | ICD-10-CM | POA: Diagnosis present

## 2023-04-17 DIAGNOSIS — Z23 Encounter for immunization: Secondary | ICD-10-CM | POA: Diagnosis not present

## 2023-04-17 DIAGNOSIS — O26899 Other specified pregnancy related conditions, unspecified trimester: Secondary | ICD-10-CM

## 2023-04-17 LAB — COMPREHENSIVE METABOLIC PANEL
ALT: 13 U/L (ref 0–44)
AST: 16 U/L (ref 15–41)
Albumin: 2.9 g/dL — ABNORMAL LOW (ref 3.5–5.0)
Alkaline Phosphatase: 143 U/L — ABNORMAL HIGH (ref 38–126)
Anion gap: 13 (ref 5–15)
BUN: <5 mg/dL — ABNORMAL LOW (ref 6–20)
CO2: 18 mmol/L — ABNORMAL LOW (ref 22–32)
Calcium: 9.3 mg/dL (ref 8.9–10.3)
Chloride: 106 mmol/L (ref 98–111)
Creatinine, Ser: 0.63 mg/dL (ref 0.44–1.00)
GFR, Estimated: >60 mL/min (ref >60)
Glucose, Bld: 77 mg/dL (ref 70–99)
Potassium: 3.8 mmol/L (ref 3.5–5.1)
Sodium: 137 mmol/L (ref 135–145)
Total Bilirubin: 0.6 mg/dL (ref 0.0–1.2)
Total Protein: 7 g/dL (ref 6.5–8.1)

## 2023-04-17 LAB — TYPE AND SCREEN
ABO/RH(D): O NEG
Antibody Screen: POSITIVE

## 2023-04-17 LAB — CBC
HCT: 34 % — ABNORMAL LOW (ref 36.0–46.0)
Hemoglobin: 11.6 g/dL — ABNORMAL LOW (ref 12.0–15.0)
MCH: 29.4 pg (ref 26.0–34.0)
MCHC: 34.1 g/dL (ref 30.0–36.0)
MCV: 86.1 fL (ref 80.0–100.0)
Platelets: 380 10*3/uL (ref 150–400)
RBC: 3.95 MIL/uL (ref 3.87–5.11)
RDW: 13.6 % (ref 11.5–15.5)
WBC: 10.5 10*3/uL (ref 4.0–10.5)
nRBC: 0 % (ref 0.0–0.2)

## 2023-04-17 LAB — PROTEIN / CREATININE RATIO, URINE
Creatinine, Urine: 45 mg/dL
Protein Creatinine Ratio: 0.22 mg/mg{creat} — ABNORMAL HIGH (ref 0.00–0.15)
Total Protein, Urine: 10 mg/dL

## 2023-04-17 MED ORDER — TERBUTALINE SULFATE 1 MG/ML IJ SOLN: 0.2500 mg | Freq: Once | INTRAMUSCULAR | Status: DC | PRN

## 2023-04-17 MED ORDER — LACTATED RINGERS IV SOLN: 500.0000 mL | INTRAVENOUS | Status: DC | PRN

## 2023-04-17 MED ORDER — FUROSEMIDE 20 MG PO TABS
20.0000 mg | ORAL_TABLET | Freq: Every day | ORAL | Status: DC
  Administered 2023-04-18 – 2023-04-19 (×2): 20 mg via ORAL
  Filled 2023-04-17 (×2): qty 1

## 2023-04-17 MED ORDER — PHENYLEPHRINE 80 MCG/ML (10ML) SYRINGE FOR IV PUSH (FOR BLOOD PRESSURE SUPPORT): 80.0000 ug | PREFILLED_SYRINGE | INTRAVENOUS | Status: DC | PRN

## 2023-04-17 MED ORDER — LIDOCAINE-EPINEPHRINE (PF) 1.5 %-1:200000 IJ SOLN
INTRAMUSCULAR | Status: DC | PRN
  Administered 2023-04-17: 3 mL via EPIDURAL

## 2023-04-17 MED ORDER — DIPHENHYDRAMINE HCL 50 MG/ML IJ SOLN: 12.5000 mg | INTRAMUSCULAR | Status: DC | PRN

## 2023-04-17 MED ORDER — FENTANYL-BUPIVACAINE-NACL 0.5-0.125-0.9 MG/250ML-% EP SOLN
12.0000 mL/h | EPIDURAL | Status: DC | PRN
  Administered 2023-04-17: 12 mL/h via EPIDURAL
  Filled 2023-04-17: qty 250

## 2023-04-17 MED ORDER — OXYCODONE-ACETAMINOPHEN 5-325 MG PO TABS: 1.0000 | ORAL_TABLET | ORAL | Status: DC | PRN

## 2023-04-17 MED ORDER — LACTATED RINGERS IV SOLN: INTRAVENOUS | Status: DC

## 2023-04-17 MED ORDER — EPHEDRINE 5 MG/ML INJ: 10.0000 mg | INTRAVENOUS | Status: DC | PRN

## 2023-04-17 MED ORDER — SOD CITRATE-CITRIC ACID 500-334 MG/5ML PO SOLN: 30.0000 mL | ORAL | Status: DC | PRN

## 2023-04-17 MED ORDER — ONDANSETRON HCL 4 MG/2ML IJ SOLN
4.0000 mg | Freq: Four times a day (QID) | INTRAMUSCULAR | Status: DC | PRN
  Administered 2023-04-17: 4 mg via INTRAVENOUS
  Filled 2023-04-17: qty 2

## 2023-04-17 MED ORDER — ACETAMINOPHEN 325 MG PO TABS: 650.0000 mg | ORAL_TABLET | ORAL | Status: DC | PRN

## 2023-04-17 MED ORDER — LACTATED RINGERS IV SOLN: 500.0000 mL | Freq: Once | INTRAVENOUS | Status: DC

## 2023-04-17 MED ORDER — OXYTOCIN-SODIUM CHLORIDE 30-0.9 UT/500ML-% IV SOLN
2.5000 [IU]/h | INTRAVENOUS | Status: DC
  Administered 2023-04-17: 2.5 [IU]/h via INTRAVENOUS
  Filled 2023-04-17: qty 500

## 2023-04-17 MED ORDER — OXYTOCIN-SODIUM CHLORIDE 30-0.9 UT/500ML-% IV SOLN
1.0000 m[IU]/min | INTRAVENOUS | Status: DC
  Administered 2023-04-17: 2 m[IU]/min via INTRAVENOUS

## 2023-04-17 MED ORDER — OXYTOCIN BOLUS FROM INFUSION
333.0000 mL | Freq: Once | INTRAVENOUS | Status: AC
  Administered 2023-04-17: 333 mL via INTRAVENOUS

## 2023-04-17 MED ORDER — LIDOCAINE HCL (PF) 1 % IJ SOLN: 30.0000 mL | INTRAMUSCULAR | Status: DC | PRN

## 2023-04-17 MED ORDER — OXYCODONE-ACETAMINOPHEN 5-325 MG PO TABS: 2.0000 | ORAL_TABLET | ORAL | Status: DC | PRN

## 2023-04-17 NOTE — Progress Notes (Signed)
    TELEHEALTH OBSTETRICS VISIT ENCOUNTER NOTE  Provider location: Center for [facility]   Patient location: Home  I connected with [facility] on 04/17/23 at 10:15 AM EST by telephone at home and verified that I am speaking with the correct person using two identifiers. Of note, unable to do video encounter due to technical difficulties.    I discussed the limitations, risks, security and privacy concerns of performing an evaluation and management service by telephone and the availability of in person appointments. I also discussed with the patient that there may be a patient responsible charge related to this service. The patient expressed understanding and agreed to proceed.  Subjective:  Katherine [facility] is a 23 y.o. [id number] at [redacted]w[redacted]d being followed for ongoing prenatal care.  She is currently monitored for the following issues for this high-risk pregnancy and has Pregnancy; History of preterm delivery; Obesity affecting pregnancy; Shoulder dystocia during labor and delivery; Depression; Rh negative state in antepartum period; and Elevated blood pressure affecting pregnancy in second trimester, antepartum on their problem list.  Patient reports no complaints. Reports fetal movement. Denies any contractions, bleeding or leaking of fluid.   The following portions of the patient's history were reviewed and updated as appropriate: allergies, current medications, past family history, past medical history, past social history, past surgical history and problem list.   Objective:  Last menstrual period 07/24/2022. General:  Alert, oriented and cooperative.   Mental Status: Normal mood and affect perceived. Normal judgment and thought content.  Rest of physical exam deferred due to type of encounter  Assessment and Plan:  Pregnancy: G3P1102 at [redacted]w[redacted]d 1. Shoulder dystocia during labor and delivery (Primary) Counseled about risks. Baby was 6 lb 2 oxz 3 days  ago Risk of SD is less Risks of C-section reviewed  2. Rh negative state in antepartum period S/ p Rhogam  3. Pre-eclampsia in third trimester For delivery now--will arrange transportation to the hospital  4. [redacted] weeks gestation of pregnancy    Term labor symptoms and general obstetric precautions including but not limited to vaginal bleeding, contractions, leaking of fluid and fetal movement were reviewed in detail with the patient.  I discussed the assessment and treatment plan with the patient. The patient was provided an opportunity to ask questions and all were answered. The patient agreed with the plan and demonstrated an understanding of the instructions. The patient was advised to call back or seek an in-person office evaluation/go to MAU at [facility] for any urgent or concerning symptoms. Please refer to After Visit Summary for other counseling recommendations.   I provided 9 minutes of non-face-to-face time during this encounter.  No follow-ups on file.  Future Appointments  Date Time Provider Department Center  04/22/2023  1:15 PM Venora Maples, MD [facility]    Reva Bores, MD Center for [facility], [facility]

## 2023-04-17 NOTE — Discharge Summary (Signed)
Postpartum Discharge Summary     Patient Name: Katherine [facility] DOB: 05-14-2000 MRN: [id number]  Date of admission: 04/17/2023 Delivery date:04/17/2023 Delivering provider: Jacklyn Shell Date of discharge: 04/19/2023  Admitting diagnosis: Encounter for induction of labor [Z34.90] Intrauterine pregnancy: [redacted]w[redacted]d     Secondary diagnosis:  Principal Problem:   Encounter for induction of labor Active Problems:   Vaginal delivery   Mild preeclampsia  Additional problems: none    Discharge diagnosis: Term Pregnancy Delivered and Preeclampsia (mild)                                              Post partum procedures: none Augmentation: AROM and Pitocin Complications: None  Hospital course: Induction of Labor With Vaginal Delivery   23 y.o. yo [id number] at [redacted]w[redacted]d was admitted to the hospital 04/17/2023 for induction of labor.  Indication for induction: Preeclampsia.  Patient had an labor course complicated by noithing Membrane Rupture Time/Date: 6:51 PM,04/17/2023  Delivery Method:Vaginal, Spontaneous Operative Delivery:N/A Episiotomy:   Lacerations:  None Details of delivery can be found in separate delivery note.  Patient had a postpartum course complicated by none. Patient is discharged home 04/19/23.  Newborn Data: Birth date:04/17/2023 Birth time:10:12 PM Gender:Female Living status:Living Apgars:8 ,9  Weight:3030 g  Magnesium Sulfate received: No BMZ received: No Rhophylac:Yes MMR:N/A T-DaP:Given prenatally Flu: No RSV Vaccine received: No Transfusion:No  Immunizations received: Immunization History  Administered Date(s) Administered   DTaP 08/01/1998, 11/08/1998, 01/09/1999, 06/06/1999, 11/08/2003   HIB (PRP-OMP) 08/01/1998, 11/08/1998, 01/09/1999, 06/06/1999   Hepatitis A, Ped/Adol-2 Dose 12/06/2009, 01/07/2013   Hepatitis B, PED/ADOLESCENT 08/01/1998, 11/08/1998, 01/09/1999   Hpv-Unspecified 01/07/2013, 03/23/2013, 10/07/2013   IPV 08/01/1998, 11/08/1998,  06/06/1999, 11/08/2003   Influenza, Seasonal, Injecte, Preservative Fre 12/30/2022   MMR 06/06/1999, 11/08/2003   Meningococcal Conjugate 12/06/2009   Pneumococcal-Unspecified 03/13/1999, 09/28/2002   Tdap 12/06/2009, 04/10/2023   Varicella 12/10/2000, 12/06/2009    Physical exam  Vitals:   04/18/23 1238 04/18/23 1437 04/18/23 2020 04/19/23 0517  BP: (!) 141/81 129/77 135/73 112/73  Pulse: 89 91 93 81  Resp: 20  20 20   Temp: 98.4 F (36.9 C)  98 F (36.7 C) 97.6 F (36.4 C)  TempSrc: Oral  Oral Oral  SpO2:   98% 100%  Weight:      Height:       General: alert, cooperative, and no distress Lochia: appropriate Uterine Fundus: firm Incision: N/A DVT Evaluation: No evidence of DVT seen on physical exam. Labs: Lab Results  Component Value Date   WBC 10.5 04/17/2023   HGB 11.6 (L) 04/17/2023   HCT 34.0 (L) 04/17/2023   MCV 86.1 04/17/2023   PLT 380 04/17/2023      Latest Ref Rng & Units 04/17/2023    2:17 PM  CMP  Glucose 70 - 99 mg/dL 77   BUN 6 - 20 mg/dL <5   Creatinine 1.30 - 1.00 mg/dL 8.65   Sodium 784 - 696 mmol/L 137   Potassium 3.5 - 5.1 mmol/L 3.8   Chloride 98 - 111 mmol/L 106   CO2 22 - 32 mmol/L 18   Calcium 8.9 - 10.3 mg/dL 9.3   Total Protein 6.5 - 8.1 g/dL 7.0   Total Bilirubin 0.0 - 1.2 mg/dL 0.6   Alkaline Phos 38 - 126 U/L 143   AST 15 - 41 U/L 16   ALT 0 -  44 U/L 13    Edinburgh Score:    04/18/2023   12:38 PM  Edinburgh Postnatal Depression Scale Screening Tool  I have been able to laugh and see the funny side of things. 0  I have looked forward with enjoyment to things. 0  I have blamed myself unnecessarily when things went wrong. 1  I have been anxious or worried for no good reason. 2  I have felt scared or panicky for no good reason. 1  Things have been getting on top of me. 1  I have been so unhappy that I have had difficulty sleeping. 0  I have felt sad or miserable. 1  I have been so unhappy that I have been crying. 1  The  thought of harming myself has occurred to me. 0  Edinburgh Postnatal Depression Scale Total 7   Edinburgh Postnatal Depression Scale Total: 7   After visit meds:  Allergies as of 04/19/2023   No Known Allergies      Medication List     TAKE these medications    acetaminophen 500 MG tablet Commonly known as: TYLENOL Take 500 mg by mouth every 6 (six) hours as needed.   Blood Pressure Mon/Auto/Wrist Devi Take Blood Pressure Twice a Day   cyclobenzaprine 5 MG tablet Commonly known as: FLEXERIL Take 1 tablet (5 mg total) by mouth 3 (three) times daily as needed for muscle spasms.   furosemide 20 MG tablet Commonly known as: LASIX Take 1 tablet (20 mg total) by mouth daily.   ibuprofen 600 MG tablet Commonly known as: ADVIL Take 1 tablet (600 mg total) by mouth every 6 (six) hours.   multivitamin-prenatal 27-0.8 MG Tabs tablet Take 1 tablet by mouth daily at 12 noon.   ondansetron 4 MG tablet Commonly known as: Zofran Take 1 tablet (4 mg total) by mouth every 8 (eight) hours as needed for nausea or vomiting.   senna-docusate 8.6-50 MG tablet Commonly known as: Senokot-S Take 2 tablets by mouth daily.         Discharge home in stable condition Infant Feeding: Breast Infant Disposition:home with mother Discharge instruction: per After Visit Summary and Postpartum booklet. Activity: Advance as tolerated. Pelvic rest for 6 weeks.  Diet: routine diet Future Appointments: Future Appointments  Date Time Provider Department Center  04/22/2023  1:15 PM Venora Maples, MD [facility]  05/16/2023  2:10 PM Bess Kinds, MD [facility]   Follow up Visit:   Please schedule this patient for a In person postpartum visit in 1 week with the following provider: Any provider. Additional Postpartum F/U:BP check 1 week and wants BTL, papers signed 04/02/23   Low risk pregnancy complicated by: HTN Delivery mode:  Vaginal, Spontaneous Anticipated Birth Control:   IP  depo   04/19/2023 Hessie Dibble, MD

## 2023-04-17 NOTE — Anesthesia Procedure Notes (Signed)
Epidural Patient location during procedure: OB Start time: 04/17/2023 6:09 PM End time: 04/17/2023 6:26 PM  Staffing Anesthesiologist: Lewie Loron, MD Performed: anesthesiologist   Preanesthetic Checklist Completed: patient identified, IV checked, risks and benefits discussed, monitors and equipment checked, pre-op evaluation and timeout performed  Epidural Patient position: sitting Prep: DuraPrep and site prepped and draped Patient monitoring: heart rate, continuous pulse ox and blood pressure Approach: midline Location: L3-L4 Injection technique: LOR air and LOR saline  Needle:  Needle type: Tuohy  Needle gauge: 17 G Needle length: 9 cm Needle insertion depth: 8 cm Catheter type: closed end flexible Catheter size: 19 Gauge Catheter at skin depth: 14 cm Test dose: negative and 1.5% lidocaine with Epi 1:200 K  Assessment Sensory level: T8 Events: blood not aspirated, no cerebrospinal fluid, injection not painful, no injection resistance, no paresthesia and negative IV test  Additional Notes Reason for block:procedure for pain

## 2023-04-17 NOTE — H&P (Signed)
LABOR AND DELIVERY ADMISSION HISTORY AND PHYSICAL NOTE  K[facility] is a 23 y.o. female [phone] with IUP at [redacted]w[redacted]d by 8 wk u/ presenting for iol for preE.   She reports positive fetal movement. She denies leakage of fluid or vaginal bleeding. No ha or vision change or upper abd pain or dyspnea.  Prenatal History/Complications:  Past Medical History: Past Medical History:  Diagnosis Date   Anxiety    Depression     Past Surgical History: Past Surgical History:  Procedure Laterality Date   CHOLECYSTECTOMY      Obstetrical History: OB History     Gravida  3   Para  2   Term  1   Preterm  1   AB  0   Living  2      SAB  0   IAB  0   Ectopic  0   Multiple  0   Live Births  2           Social History: Social History   Socioeconomic History   Marital status: Single    Spouse name: Not on file   Number of children: Not on file   Years of education: Not on file   Highest education level: Not on file  Occupational History   Not on file  Tobacco Use   Smoking status: Never   Smokeless tobacco: Never  Vaping Use   Vaping status: Never Used  Substance and Sexual Activity   Alcohol use: Not Currently   Drug use: Not Currently    Frequency: 1.0 times per week    Types: Marijuana    Comment: last use Dec 23 2022   Sexual activity: Not Currently  Other Topics Concern   Not on file  Social History Narrative   Not on file   Social Drivers of Health   Financial Resource Strain: Not on file  Food Insecurity: No Food Insecurity (04/17/2023)   Hunger Vital Sign    Worried About Running Out of Food in the Last Year: Never true    Ran Out of Food in the Last Year: Never true  Transportation Needs: No Transportation Needs (04/17/2023)   PRAPARE - Administrator, Civil Service (Medical): No    Lack of Transportation (Non-Medical): No  Physical Activity: Not on file  Stress: Not on file  Social Connections: Patient Declined (04/17/2023)    Social Connection and Isolation Panel [NHANES]    Frequency of Communication with Friends and Family: Patient declined    Frequency of Social Gatherings with Friends and Family: Patient declined    Attends Religious Services: Patient declined    Database administrator or Organizations: Patient declined    Attends Engineer, structural: Patient declined    Marital Status: Patient declined    Family History: Family History  Problem Relation Age of Onset   Hypertension Mother     Allergies: No Known Allergies  Medications Prior to Admission  Medication Sig Dispense Refill Last Dose/Taking   acetaminophen (TYLENOL) 500 MG tablet Take 500 mg by mouth every 6 (six) hours as needed. (Patient not taking: Reported on 04/17/2023)   Not Taking   Blood Pressure Monitoring (BLOOD PRESSURE MON/AUTO/WRIST) DEVI Take Blood Pressure Twice a Day (Patient not taking: Reported on 04/17/2023) 1 each 1    cyclobenzaprine (FLEXERIL) 5 MG tablet Take 1 tablet (5 mg total) by mouth 3 (three) times daily as needed for muscle spasms. 20 tablet 0  ondansetron (ZOFRAN) 4 MG tablet Take 1 tablet (4 mg total) by mouth every 8 (eight) hours as needed for nausea or vomiting. 20 tablet 0    Prenatal Vit-Fe Fumarate-FA (MULTIVITAMIN-PRENATAL) 27-0.8 MG TABS tablet Take 1 tablet by mouth daily at 12 noon. (Patient not taking: Reported on 04/17/2023) 90 tablet 1      Review of Systems   All systems reviewed and negative except as stated in HPI  Blood pressure 129/87, pulse (!) 107, resp. rate 16, last menstrual period 07/24/2022. General appearance:  Lungs: clear to auscultation bilaterally Heart: regular rate and rhythm Abdomen: soft, non-tender; bowel sounds normal Extremities: No calf swelling or tenderness Presentation: cephalic per rn exam Fetal monitoring: 125/mod/+a/-d Uterine activity: q 10 Dilation: 4 Effacement (%): 60 Station: -3 Exam by:: Lorn Junes, RNC   Prenatal labs: ABO, Rh:  --/--/PENDING (01/23 1415) Antibody: PENDING (01/23 1415) Rubella: 5.39 (06/18 1648) RPR: Non Reactive (06/18 1648)  HBsAg: Negative (06/18 1648)  HIV: Non Reactive (06/18 1648)  GBS: Negative/-- (01/16 1535)  1 hr Glucola: abnormal, 3-hour normal Genetic screening:  low-risk cfdna Anatomy US: wnl  Prenatal Transfer Tool  Maternal Diabetes: No Genetic Screening: Normal Maternal Ultrasounds/Referrals: Normal Fetal Ultrasounds or other Referrals:  None Maternal Substance Abuse:  No Significant Maternal Medications:  None Significant Maternal Lab Results: Group B Strep negative  Results for orders placed or performed during the hospital encounter of 04/17/23 (from the past 24 hours)  Type and screen [facility]   Collection Time: 04/17/23  2:15 PM  Result Value Ref Range   ABO/RH(D) PENDING    Antibody Screen PENDING    Sample Expiration      04/20/2023,2359 Performed at [facility], [address], [place], [place] [address]   CBC   Collection Time: 04/17/23  2:17 PM  Result Value Ref Range   WBC 10.5 4.0 - 10.5 K/uL   RBC 3.95 3.87 - 5.11 MIL/uL   Hemoglobin 11.6 (L) 12.0 - 15.0 g/dL   HCT 60.4 (L) 54.0 - 98.1 %   MCV 86.1 80.0 - 100.0 fL   MCH 29.4 26.0 - 34.0 pg   MCHC 34.1 30.0 - 36.0 g/dL   RDW 19.1 47.8 - 29.5 %   Platelets 380 150 - 400 K/uL   nRBC 0.0 0.0 - 0.2 %    Patient Active Problem List   Diagnosis Date Noted   Encounter for induction of labor 04/17/2023   Elevated blood pressure affecting pregnancy in second trimester, antepartum 01/11/2023   Rh negative state in antepartum period 09/27/2022   Depression 09/23/2022   Shoulder dystocia during labor and delivery 02/09/2021   History of preterm delivery 02/06/2021   Obesity affecting pregnancy 02/06/2021   Pregnancy 08/25/2020    Assessment: Katherine [facility] is a 23 y.o. [id number] at [redacted]w[redacted]d here for iol for mild preE (diagnosed at [facility]  [facility])  #Labor:favorable cervix, start pitocin, probably arom at some point #Pain: Eventual epidural #FWB: Cat 1 #ID:  Gbs neg #MOF: br #MOC: btl, says has copy of consent will bring it. Given BMI advised will be surgeon's discretion whether to proceed with postpartum tubal versus interval laparoscopic procedure #Circ:  Yes, inpt #rh neg: w/u PP #hx shoulder dystocia: efw 30%, we reviewed relative risks of surgery versus vaginal delivery, patient desires vaginal delivery, and given normal efw I think this is very reasonable #preE: mild, labs pending, asymptomatic  Silvano Bilis 04/17/2023, 2:53 PM

## 2023-04-17 NOTE — Anesthesia Preprocedure Evaluation (Addendum)
Anesthesia Evaluation  Patient identified by MRN, date of birth, ID band Patient awake    Reviewed: Allergy & Precautions, Patient's Chart, lab work & pertinent test results  History of Anesthesia Complications Negative for: history of anesthetic complications  Airway Mallampati: III  TM Distance: >3 FB Neck ROM: Full    Dental no notable dental hx.    Pulmonary neg pulmonary ROS   Pulmonary exam normal        Cardiovascular negative cardio ROS Normal cardiovascular exam     Neuro/Psych  PSYCHIATRIC DISORDERS Anxiety Depression       GI/Hepatic negative GI ROS, Neg liver ROS,,,  Endo/Other    Class 3 obesity  Renal/GU negative Renal ROS  negative genitourinary   Musculoskeletal negative musculoskeletal ROS (+)    Abdominal  (+) + obese  Peds  Hematology  (+) Blood dyscrasia (Hgb 11.6), anemia   Anesthesia Other Findings Day of surgery medications reviewed with patient.  Reproductive/Obstetrics (+) Pregnancy                             Anesthesia Physical Anesthesia Plan  ASA: 3  Anesthesia Plan: Epidural   Post-op Pain Management:    Induction:   PONV Risk Score and Plan: Treatment may vary due to age or medical condition  Airway Management Planned: Natural Airway  Additional Equipment: Fetal Monitoring  Intra-op Plan:   Post-operative Plan:   Informed Consent: I have reviewed the patients History and Physical, chart, labs and discussed the procedure including the risks, benefits and alternatives for the proposed anesthesia with the patient or authorized representative who has indicated his/her understanding and acceptance.       Plan Discussed with:   Anesthesia Plan Comments:        Anesthesia Quick Evaluation

## 2023-04-17 NOTE — Progress Notes (Signed)
Patient Vitals for the past 4 hrs:  BP Temp Temp src Pulse Resp SpO2  04/17/23 1851 129/78 -- -- 93 -- 100 %  04/17/23 1846 127/80 -- -- (!) 102 -- 99 %  04/17/23 1842 132/71 -- -- (!) 107 16 --  04/17/23 1839 132/75 -- -- (!) 109 14 --  04/17/23 1831 126/70 -- -- (!) 109 16 99 %  04/17/23 1822 133/78 98.8 F (37.1 C) Oral (!) 101 18 --  04/17/23 1730 137/85 -- -- (!) 106 18 --  04/17/23 1626 137/81 -- -- 100 16 --  04/17/23 1549 139/79 -- -- (!) 109 16 --  04/17/23 1515 121/83 -- -- (!) 104 14 --   Comfortable w/epidural./  FHR Cat 1.  Ctx q 2-3 minutes, pitocin at 10 mu/min.  Cx 4/thick/-2.  AROM w/clear fluid.  Continue present mgt.

## 2023-04-18 LAB — RPR: RPR Ser Ql: NONREACTIVE

## 2023-04-18 MED ORDER — SENNOSIDES-DOCUSATE SODIUM 8.6-50 MG PO TABS
2.0000 | ORAL_TABLET | Freq: Every day | ORAL | Status: DC
  Filled 2023-04-18: qty 2

## 2023-04-18 MED ORDER — ONDANSETRON HCL 4 MG PO TABS: 4.0000 mg | ORAL_TABLET | ORAL | Status: DC | PRN

## 2023-04-18 MED ORDER — RHO D IMMUNE GLOBULIN 1500 UNIT/2ML IJ SOSY
300.0000 ug | PREFILLED_SYRINGE | Freq: Once | INTRAMUSCULAR | Status: DC
Start: 2023-04-18 — End: 2023-04-18
  Filled 2023-04-18: qty 2

## 2023-04-18 MED ORDER — IBUPROFEN 600 MG PO TABS
600.0000 mg | ORAL_TABLET | Freq: Four times a day (QID) | ORAL | Status: DC
  Administered 2023-04-18 – 2023-04-19 (×7): 600 mg via ORAL
  Filled 2023-04-18 (×7): qty 1

## 2023-04-18 MED ORDER — ONDANSETRON HCL 4 MG/2ML IJ SOLN: 4.0000 mg | INTRAMUSCULAR | Status: DC | PRN

## 2023-04-18 MED ORDER — TETANUS-DIPHTH-ACELL PERTUSSIS 5-2.5-18.5 LF-MCG/0.5 IM SUSY: 0.5000 mL | PREFILLED_SYRINGE | Freq: Once | INTRAMUSCULAR | Status: DC

## 2023-04-18 MED ORDER — WITCH HAZEL-GLYCERIN EX PADS: 1.0000 | MEDICATED_PAD | CUTANEOUS | Status: DC | PRN

## 2023-04-18 MED ORDER — DIPHENHYDRAMINE HCL 25 MG PO CAPS: 25.0000 mg | ORAL_CAPSULE | Freq: Four times a day (QID) | ORAL | Status: DC | PRN

## 2023-04-18 MED ORDER — RHO D IMMUNE GLOBULIN 1500 UNIT/2ML IJ SOSY
300.0000 ug | PREFILLED_SYRINGE | Freq: Once | INTRAMUSCULAR | Status: AC
  Administered 2023-04-18: 300 ug via INTRAVENOUS
  Filled 2023-04-18: qty 2

## 2023-04-18 MED ORDER — BENZOCAINE-MENTHOL 20-0.5 % EX AERO: 1.0000 | INHALATION_SPRAY | CUTANEOUS | Status: DC | PRN

## 2023-04-18 MED ORDER — ZOLPIDEM TARTRATE 5 MG PO TABS: 5.0000 mg | ORAL_TABLET | Freq: Every evening | ORAL | Status: DC | PRN

## 2023-04-18 MED ORDER — SIMETHICONE 80 MG PO CHEW: 80.0000 mg | CHEWABLE_TABLET | ORAL | Status: DC | PRN

## 2023-04-18 MED ORDER — MEDROXYPROGESTERONE ACETATE 150 MG/ML IM SUSP
150.0000 mg | INTRAMUSCULAR | Status: DC
  Administered 2023-04-18: 150 mg via INTRAMUSCULAR
  Filled 2023-04-18: qty 1

## 2023-04-18 MED ORDER — ACETAMINOPHEN 325 MG PO TABS
650.0000 mg | ORAL_TABLET | ORAL | Status: DC | PRN
  Administered 2023-04-18: 650 mg via ORAL
  Filled 2023-04-18: qty 2

## 2023-04-18 MED ORDER — COCONUT OIL OIL
1.0000 | TOPICAL_OIL | Status: DC | PRN
  Administered 2023-04-18: 1 via TOPICAL

## 2023-04-18 MED ORDER — PRENATAL MULTIVITAMIN CH
1.0000 | ORAL_TABLET | Freq: Every day | ORAL | Status: DC
Start: 2023-04-18 — End: 2023-04-19
  Administered 2023-04-18 – 2023-04-19 (×2): 1 via ORAL
  Filled 2023-04-18 (×2): qty 1

## 2023-04-18 MED ORDER — DIBUCAINE (PERIANAL) 1 % EX OINT: 1.0000 | TOPICAL_OINTMENT | CUTANEOUS | Status: DC | PRN

## 2023-04-18 NOTE — Anesthesia Postprocedure Evaluation (Signed)
Anesthesia Post Note  Patient: Katherine Love  Procedure(s) Performed: AN AD HOC LABOR EPIDURAL     Patient location during evaluation: Mother Baby Anesthesia Type: Epidural Level of consciousness: awake and alert Pain management: pain level controlled Vital Signs Assessment: post-procedure vital signs reviewed and stable Respiratory status: spontaneous breathing, nonlabored ventilation and respiratory function stable Cardiovascular status: stable Postop Assessment: no headache, no backache, epidural receding, no apparent nausea or vomiting, patient able to bend at knees, able to ambulate and adequate PO intake Anesthetic complications: no   No notable events documented.  Last Vitals:  Vitals:   04/18/23 0100 04/18/23 0500  BP: 133/75 126/77  Pulse: 98 99  Resp: 18 18  Temp: 36.5 C 36.6 C  SpO2: 100% 99%    Last Pain:  Vitals:   04/18/23 0852  TempSrc:   PainSc: Asleep   Pain Goal:                   Laban Emperor

## 2023-04-18 NOTE — Progress Notes (Addendum)
POSTPARTUM PROGRESS NOTE  Post Partum Day 1  Subjective:  Katherine Love is a 23 y.o. [id number] s/p NSVD at [redacted]w[redacted]d.  She reports she is doing well. No acute events overnight. She denies any problems with ambulating, voiding, or PO intake. LBM today. Denies headache, vision changes, chest pain, shortness of breath, nausea or vomiting. Pain is well controlled, reports tolerable pain at epidural site.  Lochia is normal, mild bleeding less than usual menses without clots passed. Mood is doing well.  Objective: Blood pressure 115/70, pulse 86, temperature 98.2 F (36.8 C), resp. rate 20, height 5\' 5"  (1.651 m), weight 123.6 kg, last menstrual period 07/24/2022, SpO2 99%.  Physical Exam:  General: alert, cooperative and no distress Chest: clear to auscultation bilaterally, normal work of breathing and no respiratory distress Heart: regular rate and rhythm, no murmurs auscultated, distal pulses intact Abdomen: soft, nontender Uterine Fundus: firm, appropriately tender DVT Evaluation: No calf swelling or tenderness Extremities: mild bilateral non-pitting lower extremity edema Skin: warm, dry  Recent Labs    04/17/23 1417  HGB 11.6*  HCT 34.0*   Assessment/Plan: Katherine Love is a 23 y.o. [id number] s/p NSVD at [redacted]w[redacted]d   PPD#1 - Doing well  Routine postpartum care History of mild pre-eclampsia: BP readings improved after delivery, asymptomatic  Start Lasix today Rh-negative: postpartum labs pending  Plan for Rhogam today Contraception: inpatient [facility] and outpatient BTL with papers signed Feeding: primarily breastfeeding if possible Dispo: Plan for discharge 04/19/2023.   LOS: 1 day   Estrella Deeds, Medical Student 04/18/2023, 10:15 AM   GME ATTESTATION:  Evaluation and management procedures were performed by the Medical Student under my supervision. I was immediately available for direct supervision, assistance and direction throughout this encounter.  I also confirm that I have  verified the information documented in the resident's note, and that I have also personally reperformed the pertinent components of the physical exam and all of the medical decision making activities.  I have also made any necessary editorial changes.  Wyn Forster, MD OB Fellow, Faculty Practice [facility], Center for [facility] 04/18/2023 12:00 PM

## 2023-04-18 NOTE — Lactation Note (Addendum)
This note was copied from a baby's chart. Lactation Consultation Note  Patient Name: Katherine Love ZOXWR'U Date: 04/18/2023 Age:23 years  Reason for consult: Initial assessment;Breastfeeding assistance;Early term 37-38.6wks  P3, ETI, [redacted]w[redacted]d, poor feeding at breast  Upon arrival to room, mother is holding baby skin to skin after his bath. She says baby Katherine "[name]" had his bath and just fed for 30 minutes at breast. Discussed early term infant feeding and mother verbalized understanding. She would like to pump and stimulate her milk production. Mother wants to supplement with Enfamil formula if she is unable to provide her EBM.  Instructed mother on the use, frequency, cleaning of breast pump and storage of breast milk. Provided her with feeding guidelines and handout of milk collection, storage and handling. Mother fitted with a 18 mm flange. She was noted to have widely spaced breast. She reports having low milk supply with her previous babies.   Discussed early term infant feeding plan:  breastfeed with feeding cues and when baby is actively sucking/ feeding at breast. Stop when baby shows fatigue  Supplement with mother's expressed milk and/or formula. Refer to feeding guidelines based on age left at bedside. Pump for 15 min every 3 hours in initiation phase to stimulate milk production      Maternal Data Has patient been taught Hand Expression?: Yes Does the patient have breastfeeding experience prior to this delivery?: Yes How long did the patient breastfeed?: 1-2 months breast fed and pumped  Feeding Mother's Current Feeding Choice: Breast Milk and Formula Nipple Type: Nfant Standard Flow (white)   Lactation Tools Discussed/Used Tools: Pump;Flanges Flange Size: 18 Breast pump type: Double-Electric Breast Pump Pump Education: Setup, frequency, and cleaning;Milk Storage Reason for Pumping: poor latch, sleepy, ETI Pumping frequency: every 3 hours for 15 min in initiate  setting  Interventions Interventions: Hand express;DEBP;Education;LC Services brochure  Discharge Pump: DEBP;Personal  Consult Status Consult Status: Follow-up Date: 04/19/23 Follow-up type: In-patient    Christella Hartigan M 04/18/2023, 6:22 PM

## 2023-04-18 NOTE — Progress Notes (Signed)
MOB was referred for history of depression/anxiety.  * Referral screened out by Clinical Social Worker because none of the following criteria appear to apply:  ~ History of anxiety/depression during this pregnancy, or of post-partum depression following prior delivery.  ~ Diagnosis of anxiety and/or depression within last 3 years  Per OB notes, MOB did not indicate any signs/symptoms during her pregnancy  OR  * MOB's symptoms currently being treated with medication and/or therapy.   Please contact the Clinical Social Worker if needs arise, by [facility] request, or if MOB scores greater than 9/yes to question 10 on Edinburgh Postpartum Depression Screen.  Enos Fling, Theresia Majors Clinical Social Worker [phone]

## 2023-04-19 ENCOUNTER — Other Ambulatory Visit (HOSPITAL_COMMUNITY): Payer: Self-pay

## 2023-04-19 LAB — BIRTH TISSUE RECOVERY COLLECTION (PLACENTA DONATION)

## 2023-04-19 LAB — RH IG WORKUP (INCLUDES ABO/RH)
Fetal Screen: NEGATIVE
Gestational Age(Wks): 37.1
Unit division: 0

## 2023-04-19 MED ORDER — SENNOSIDES-DOCUSATE SODIUM 8.6-50 MG PO TABS
2.0000 | ORAL_TABLET | Freq: Every day | ORAL | 1 refills | Status: AC
  Filled 2023-04-19: qty 60, 30d supply, fill #0

## 2023-04-19 MED ORDER — FUROSEMIDE 20 MG PO TABS
20.0000 mg | ORAL_TABLET | Freq: Every day | ORAL | 0 refills | Status: AC
  Filled 2023-04-19: qty 6, 6d supply, fill #0

## 2023-04-19 MED ORDER — IBUPROFEN 600 MG PO TABS
600.0000 mg | ORAL_TABLET | Freq: Four times a day (QID) | ORAL | 0 refills | Status: AC
  Filled 2023-04-19: qty 30, 8d supply, fill #0

## 2023-04-19 NOTE — Lactation Note (Signed)
This note was copied from a baby's chart. Lactation Consultation Note  Patient Name: Boy Lundynn Cohoon ZOXWR'U Date: 04/19/2023 Age:25 hours Reason for consult: Follow-up assessment;Maternal discharge;Early term 14-38.6wks  LC entered room to provide discharge education to mom. Mom reports that she has chosen to exclusively formula feed and was guided by a staff member on breast/suppression education regarding engorgement. Mom denied further assistance or questions. LC to update feeding status.   Maternal Data    Feeding Mother's Current Feeding Choice: Formula      Interventions    Discharge Discharge Education: Other (comment) (patient states she was provided information on lactation suppression by a staff member when she decided to formula feed only today.)  Consult Status Consult Status: Complete Date: 04/19/23    Su Grand 04/19/2023, 11:09 AM

## 2023-04-22 ENCOUNTER — Encounter: Payer: Medicaid Other | Admitting: Family Medicine

## 2023-04-26 ENCOUNTER — Telehealth (HOSPITAL_COMMUNITY): Payer: Self-pay

## 2023-04-26 NOTE — Telephone Encounter (Signed)
04/26/2023  Name: Katherine Love MRN: [id number] DOB: December 13, 2000  Reason for Call:  Transition of Care Hospital Discharge Call  Contact Status: Patient Contact Status: Complete  Language assistant needed:          Follow-Up Questions: Do You Have Any Concerns About Your Health As You Heal From Delivery?: No Do You Have Any Concerns About Your Infants Health?: No  Edinburgh Postnatal Depression Scale:  In the Past 7 Days: I have been able to laugh and see the funny side of things.: As much as I always could I have looked forward with enjoyment to things.: Rather less than I used to I have blamed myself unnecessarily when things went wrong.: No, never I have been anxious or worried for no good reason.: Yes, very often I have felt scared or panicky for no good reason.: Yes, sometimes Things have been getting on top of me.: No, most of the time I have coped quite well I have been so unhappy that I have had difficulty sleeping.: Not very often I have felt sad or miserable.: Not very often I have been so unhappy that I have been crying.: Only occasionally The thought of harming myself has occurred to me.: Never [name] Postnatal Depression Scale Total: (!) 10  RN explained IBH referral and virtual counseling appointment. Patient declines referral and states she would like to look for counseling where she lives and is not interested in a virtual counseling session.   RN explained baby blues vs perinatal mood and anxiety disorders. RN encouraged patient to have a good self awareness of how she is feeling. RN told patient to reach out to her provider with increased feelings of anxiety, worry, sadness, depression, or often not feeling like herself. Patient verbalizes understanding.   RN told patient about Maternal Mental Health Resources ([facility], National Maternal Mental Health Hotline, Postpartum Support International, National Suicide and Crisis Lifeline). Also told  patient about [facility] support group offerings. Will e-mail these resources to patient as well.  PHQ2-9 Depression Scale:     Discharge Follow-up: Edinburgh score requires follow up?: Yes Provider notified of Edinburgh score?:  (RN explained IBH referral and virtual counseling appointment. Patient declines IBH referral for counseling. Notified Dr. Debroah Loop via chart message of EPDS score today.) Have you already been referred for a counseling appointment?: No Patient was advised of the following resources:: Breastfeeding Support Group, Support Group  Post-discharge interventions: Reviewed Newborn Safe Sleep Practices Maternal Mental Health Resources provided  Signature  Signe Colt

## 2023-05-02 ENCOUNTER — Telehealth: Payer: Self-pay

## 2023-05-02 DIAGNOSIS — Z3009 Encounter for other general counseling and advice on contraception: Secondary | ICD-10-CM

## 2023-05-02 NOTE — Telephone Encounter (Signed)
 Patients mother calls nurse line in regards to [facility].   She reports she faxed over FMLA forms, one for her employer and one for husbands employer, on 01/21.  I advised I do not see any chart notes we received and/or completed paperwork.   Advised will check with PCP and give her a followup call next week.

## 2023-05-03 NOTE — Telephone Encounter (Signed)
 Called to discuss that FMLA does not apply to this patient's situation and that I would not be able to fill out FMLA paperwork for her. FMLA is for people suffering from disability or for the immediate parents of a new born or adopted child. As this does not apply to this situation I cannot fill it out. I told patient that I had received FMLA paperwork from two people and that I would not be able to fill it out for either.   Patient request at this time an appointment to get her tubes tied. I told patient I would place a referral to OBGYN and she should hear something back, in the next 2 weeks, about an appointment. If not, to call the clinic about the referral.

## 2023-05-07 IMAGING — US US MFM OB DETAIL+14 WK
1 series · 13 of 28 positions shown · non-contrast
Comparison: none

[Series 1: us mfm ob detail+14 wk · 13 of 143 slices shown]
[im 6/143]
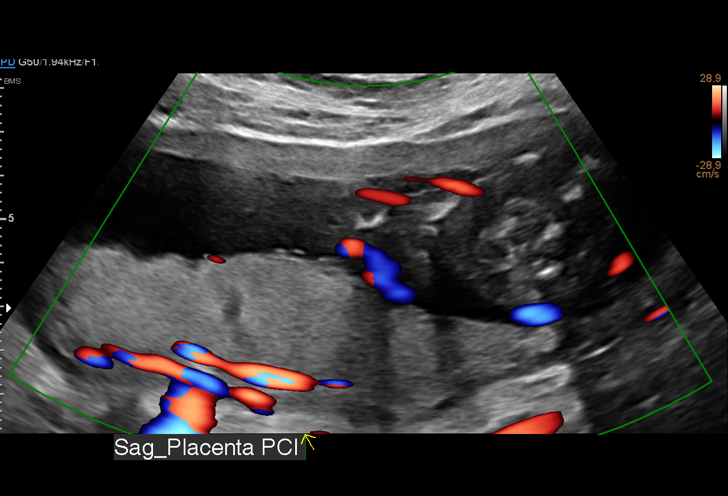
[im 16/143]
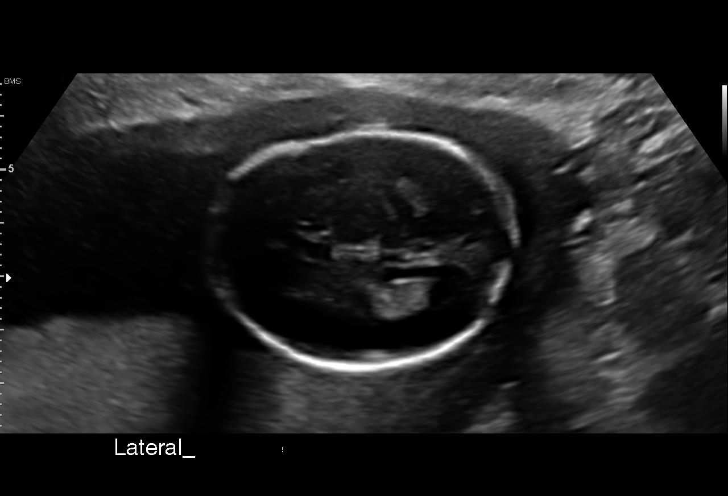
[im 27/143]
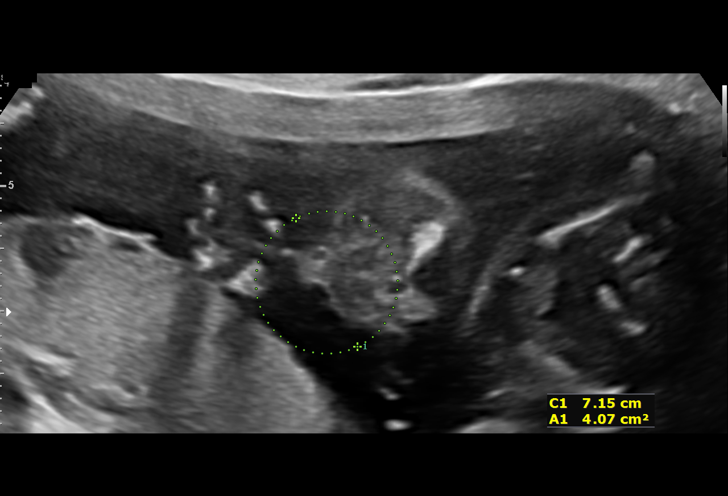
[im 37/143]
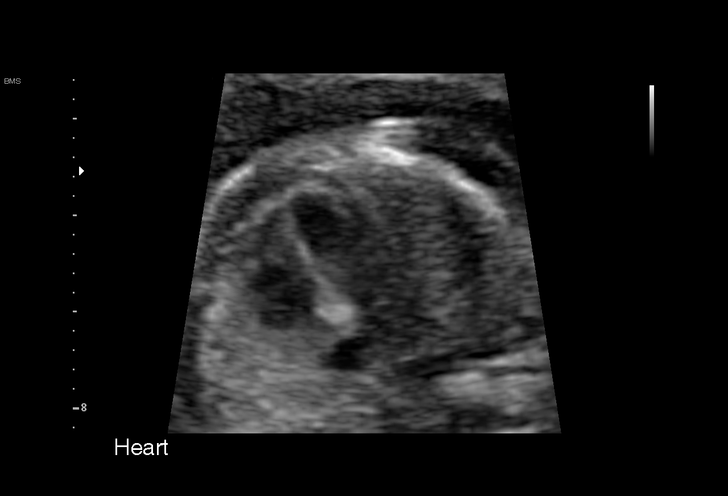
[im 48/143]
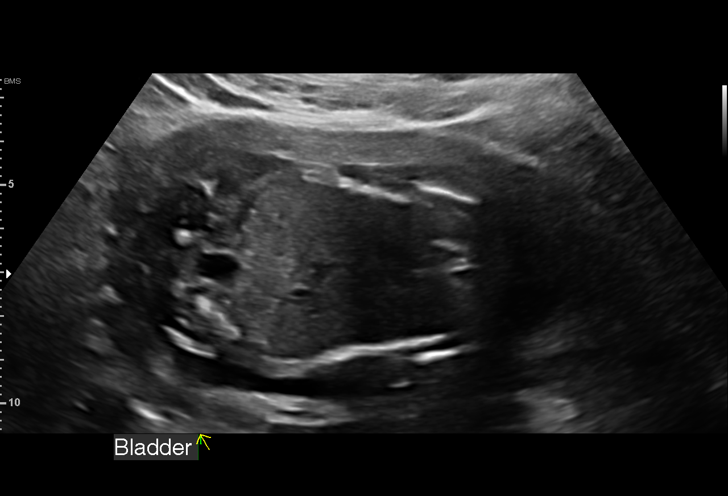
[im 58/143]
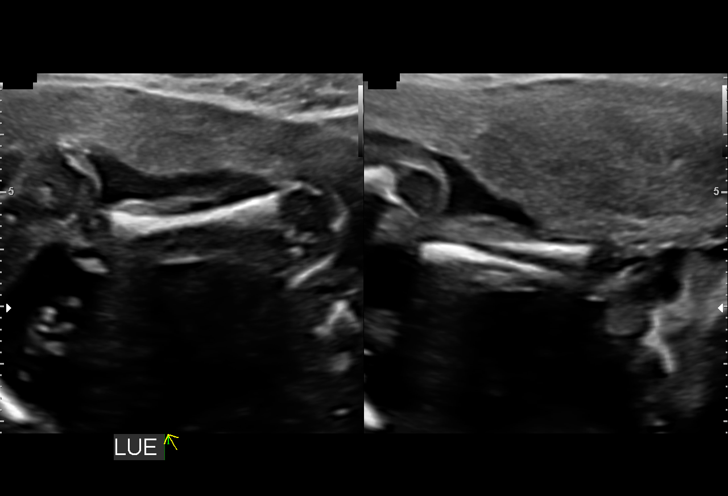
[im 74/143]
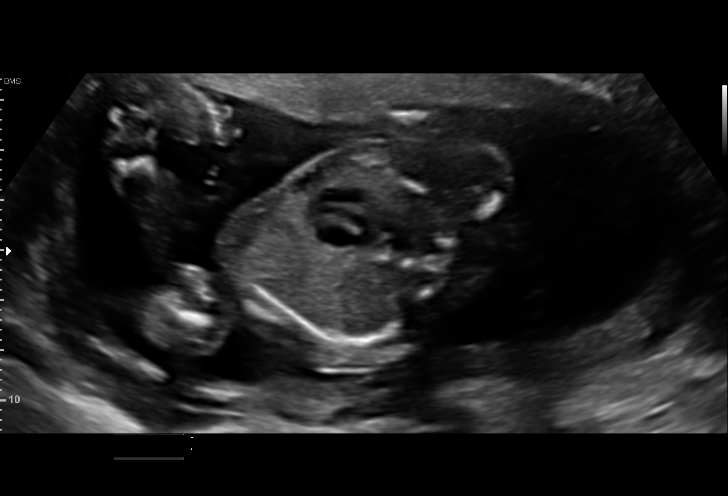
[im 85/143]
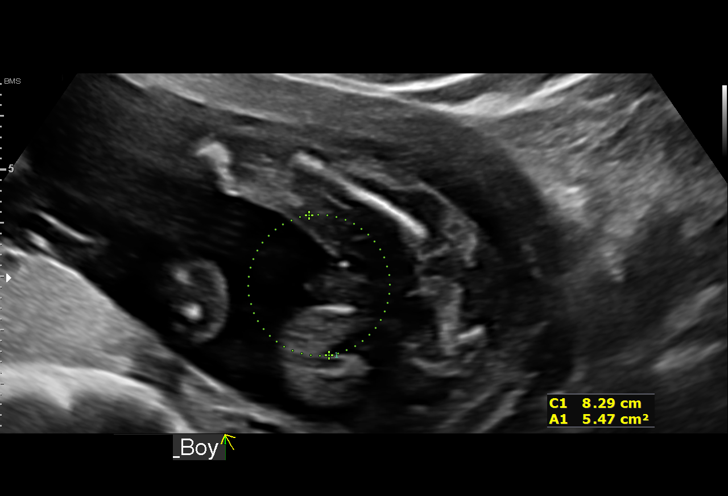
[im 95/143]
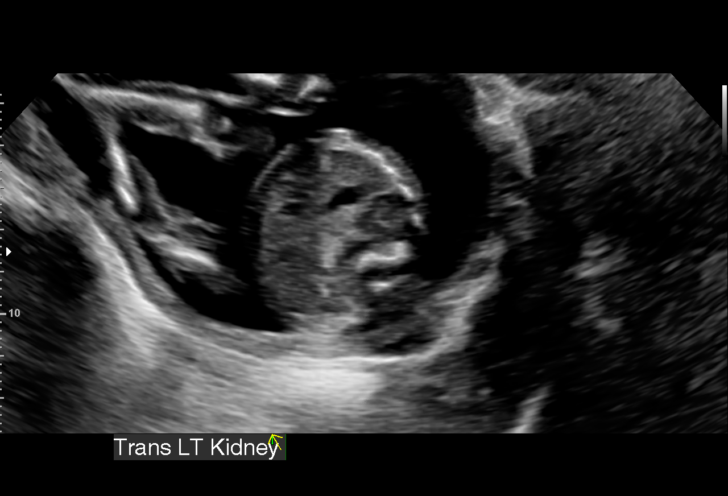
[im 106/143]
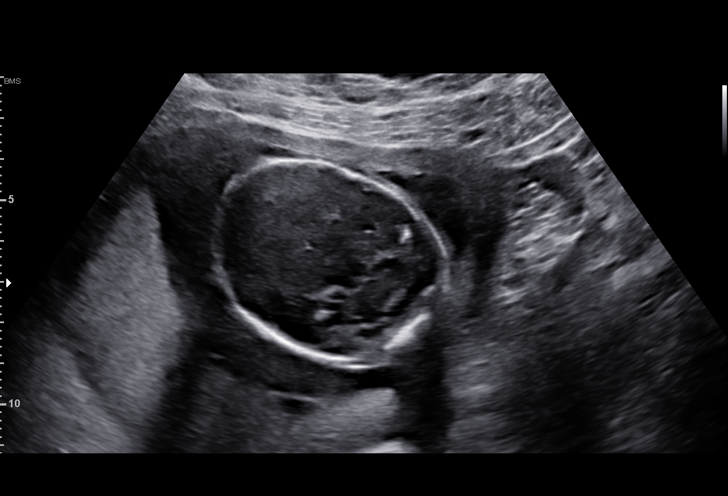
[im 116/143]
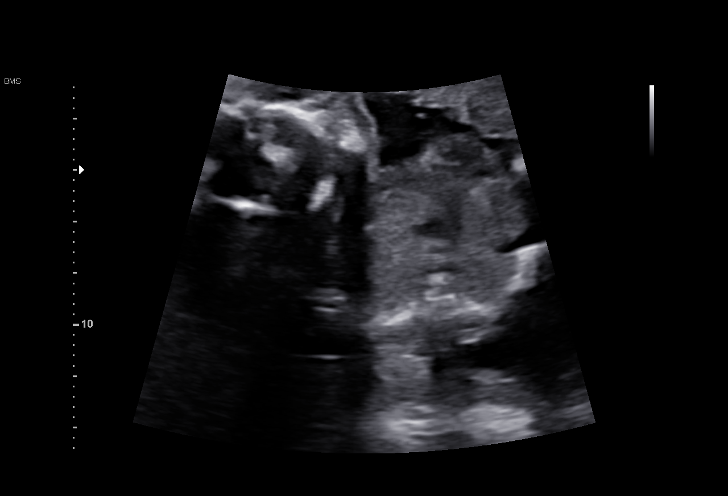
[im 127/143]
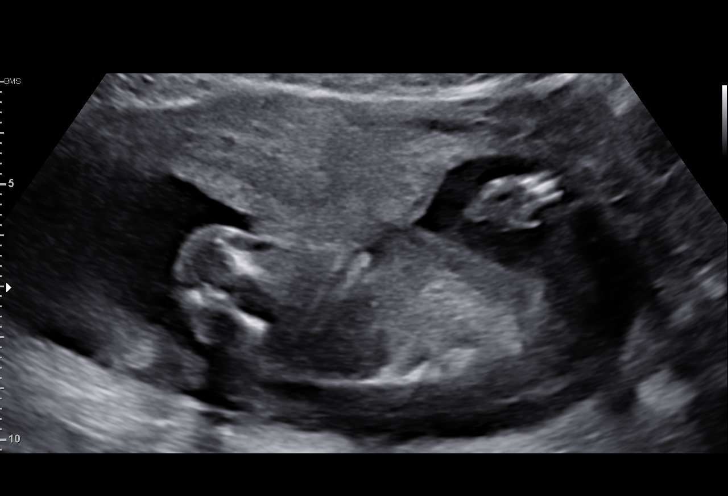
[im 137/143]
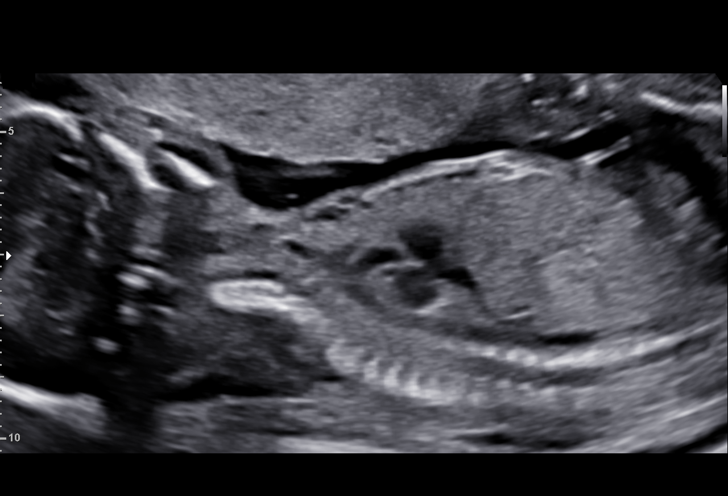

[13 of 28 positions shown; findings below may reference images not displayed]

Name:       MD ASIQULLAH JANIFA                Visit Date: 09/22/2020 [DATE]

Indications

 Obesity complicating pregnancy, second
 trimester (BMI 39)
 19 weeks gestation of pregnancy
 Encounter for antenatal screening for
 malformations
 Substance abuse affecting pregnancy,
 antepartum (THC)
 Declined Testing
Fetal Evaluation

 Num Of Fetuses:         1
 Fetal Heart Rate(bpm):  145
 Cardiac Activity:       Observed
 Presentation:           Breech
 Placenta:               Posterior
 P. Cord Insertion:      Visualized

 Amniotic Fluid
 AFI FV:      Within normal limits

                             Largest Pocket(cm)

Biometry
 BPD:      43.4  mm     G. Age:  19w 1d         43  %    CI:        71.23   %    70 - 86
                                                         FL/HC:      17.9   %    16.1 -
 HC:      163.8  mm     G. Age:  19w 1d         35  %    HC/AC:      1.08        1.09 -
 AC:      151.6  mm     G. Age:  20w 3d         80  %    FL/BPD:     67.5   %
 FL:       29.3  mm     G. Age:  19w 0d         33  %    FL/AC:      19.3   %    20 - 24
 HUM:      28.6  mm     G. Age:  19w 2d         50  %
 CER:      20.6  mm     G. Age:  19w 5d         68  %
 NFT:       4.3  mm
 LV:        5.9  mm
 CM:        5.6  mm

 Est. FW:     306  gm    0 lb 11 oz      69  %
OB History

 Gravidity:    2
 Living:       1
Gestational Age

 U/S Today:     19w 3d                                        EDD:   02/13/21
 Best:          19w 2d     Det. By:  Early Ultrasound         EDD:   02/14/21
                                     (07/01/20)
Anatomy

 Cranium:               Appears normal         Aortic Arch:            Not well visualized
 Cavum:                 Appears normal         Ductal Arch:            Not well visualized
 Ventricles:            Appears normal         Diaphragm:              Appears normal
 Choroid Plexus:        Appears normal         Stomach:                Appears normal, left
                                                                       sided
 Cerebellum:            Appears normal         Abdomen:                Appears normal
 Posterior Fossa:       Appears normal         Abdominal Wall:         Appears nml (cord
                                                                       insert, abd wall)
 Nuchal Fold:           Appears normal         Cord Vessels:           Appears normal (3
                                                                       vessel cord)
 Face:                  Orbits nl; profile not Kidneys:                Appear normal
                        well visualized
 Lips:                  Not well visualized    Bladder:                Appears normal
 Thoracic:              Appears normal         Spine:                  Not well visualized
 Heart:                 Appears normal         Upper Extremities:      Appears normal
                        (4CH, axis, and
                        situs)
 RVOT:                  Appears normal         Lower Extremities:      Appears normal
 LVOT:                  Appears normal

 Other:  Heels/feet and open hands/5th digits visualized. Fetus appears to be
         a male. Technically difficult due to maternal habitus and fetal position.
Cervix Uterus Adnexa

 Cervix
 Length:           2.63  cm.

 Uterus
 No abnormality visualized.
 Right Ovary
 Within normal limits.

 Left Ovary
 Not visualized.

 Cul De Sac
 No free fluid seen.

 Adnexa
 No adnexal mass visualized.
Comments

 This patient was seen for a detailed fetal anatomy scan due
 to maternal obesity.
 She denies any significant past medical history and denies
 any problems in her current pregnancy.
 She has not had any screening tests for fetal aneuploidy
 drawn in her current pregnancy.
 She was informed that the fetal growth and amniotic fluid
 level were appropriate for her gestational age.
 There were no obvious fetal anomalies noted on today's
 ultrasound exam.  However, today's exam was limited due to
 the maternal body habitus and the fetal position.
 The patient was informed that anomalies may be missed due
 to technical limitations. If the fetus is in a suboptimal position
 or maternal habitus is increased, visualization of the fetus in
 the maternal uterus may be impaired.
 A follow-up exam was scheduled in 4 weeks to complete the
 views of the fetal anatomy.

## 2023-05-16 ENCOUNTER — Encounter: Payer: Medicaid Other | Admitting: Student

## 2023-05-16 NOTE — Progress Notes (Deleted)
  [facility] Postpartum Visit   Katherine Love is a 23 y.o. [phone] presenting for a postpartum visit.  She has the following concerns today: *** She delivered via Vaginal delivery at 37w 1d.  She reports her vaginal bleeding is ***. She is *** feeding her infant. She feels she is bonding well. She is considering *** for contraception.   Edinburgh Postnatal Depression Scale: *** (10 or higher is positive)  Reviewed pregnancy and delivery course No complications.  -  There were no vitals filed for this visit. Exam: *** Pelvic exam: ***   A/P:  Postpartum visit: patient is 4 weeks postpartum following a Vaginal delivery. -Discussed patients delivery and complications -Patient had no laceration, perineal healing reviewed. Patient expressed understanding -Patient has urinary incontinence? {yes/no:20286}*** Patient was referred to pelvic floor PT  -Patient {ACTION; IS/IS AVW:[id number]} safe to resume physical and sexual activity -Patient {DOES_DOES NWG:95621} want a pregnancy in the next year.  Desired family size is {NUMBER 1-10:22536} children.  -Reviewed forms of contraception in tiered fashion. Patient desired {PLAN CONTRACEPTION:313102} today.   -Return to sexual activity and contraception discussed as above.  -Discussed birth spacing of 18 months -Breastfeeding: {yes/no:20286}, provided support of decision and resources as indicated  -Mood: ***  -Discussed sleep and fatigue management and encouraged family/community support.  -Reviewed prior Pap and she is next due for Pap 09/12/23.  -Return in No follow-ups on file.

## 2023-05-16 NOTE — Patient Instructions (Incomplete)
It was great to see you! Thank you for allowing me to participate in your care!  I recommend that you always bring your medications to each appointment as this makes it easy to ensure we are on the correct medications and helps Katherine Love not miss when refills are needed.  Our plans for today:  - Postpartum Check up -   We are checking some labs today, I will call you if they are abnormal will send you a MyChart message or a letter if they are normal.  If you do not hear about your labs in the next 2 weeks please let Katherine Love know.***  Take care and seek immediate care sooner if you develop any concerns.   Dr. Bess Kinds, MD [facility]

## 2023-06-19 IMAGING — US US ABDOMEN LIMITED
1 series · 15 of 25 positions shown · non-contrast
Comparison: None.

CLINICAL DATA: Nausea/vomiting, 25 weeks pregnant

EXAM:
ULTRASOUND ABDOMEN LIMITED RIGHT UPPER QUADRANT

[Series 1: us abdomen limited · 46 acquisitions, 15 frames shown]
[im 1/46]
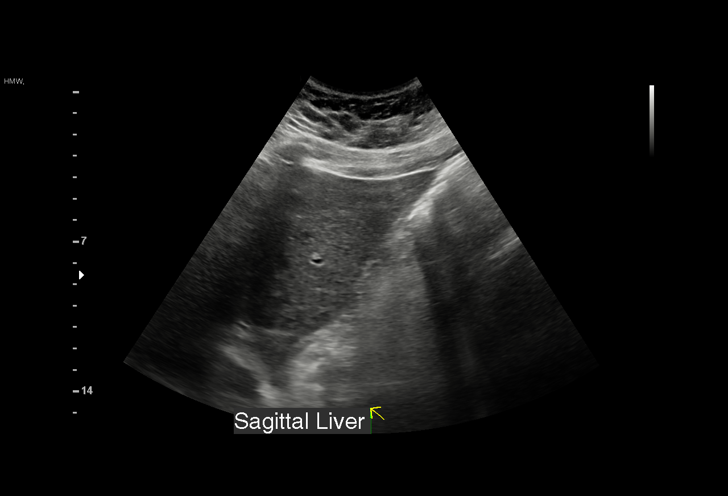
[im 4/46]
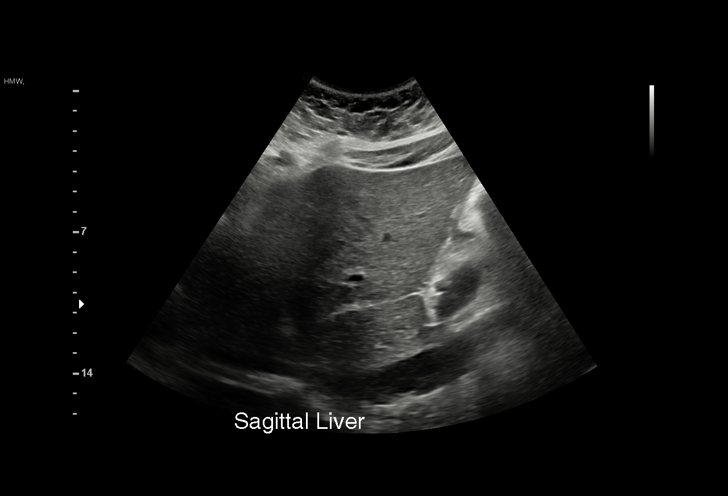
[im 8/46]
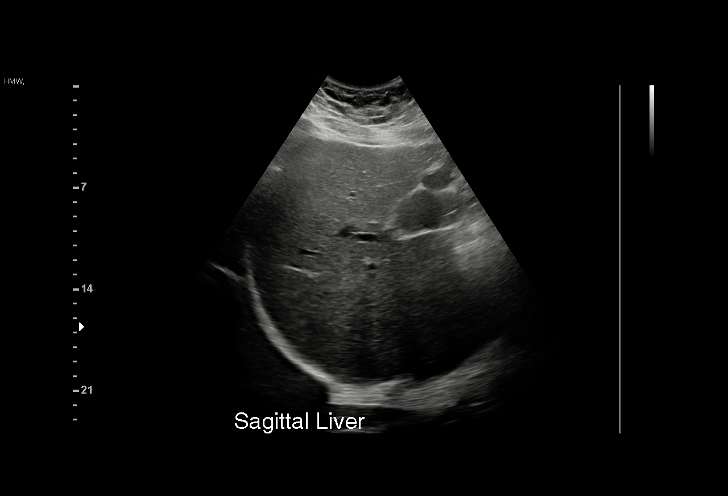
[im 10/46]
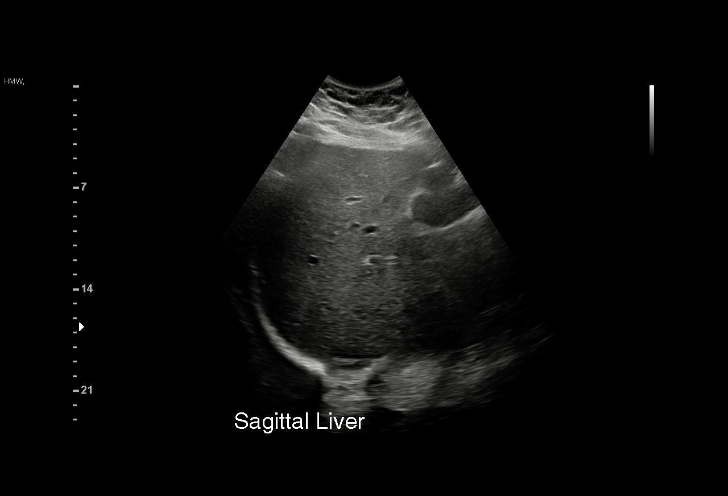
[im 14/46]
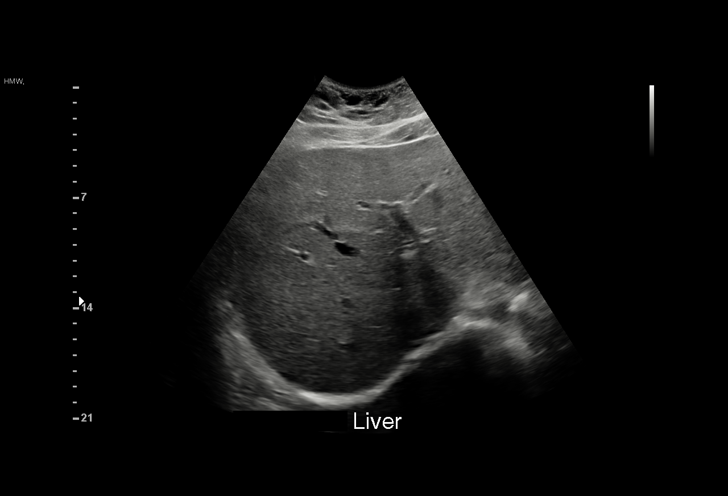
[im 17/46]
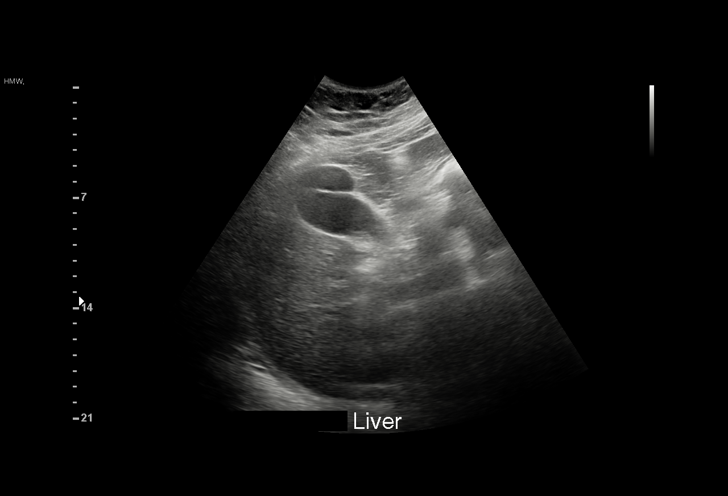
[im 19/46]
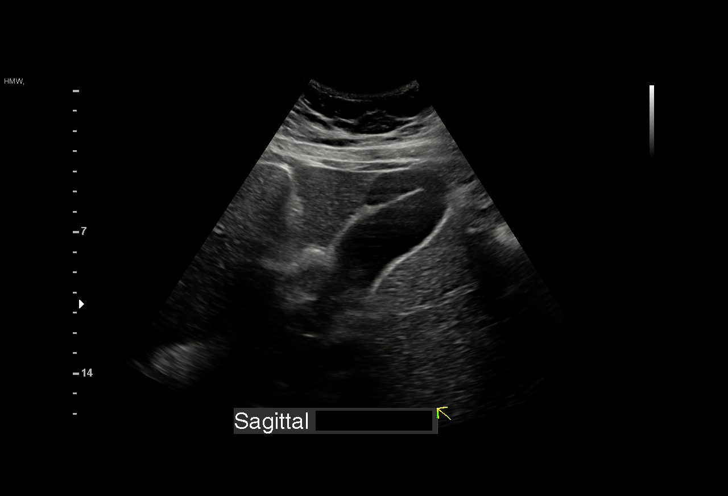
[im 23/46]
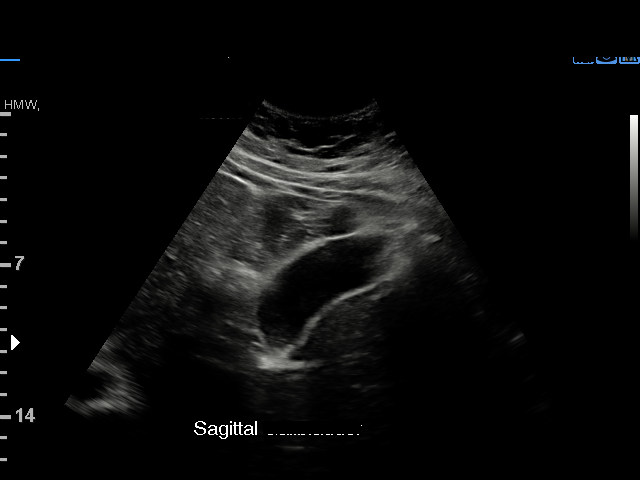
[im 27/46]
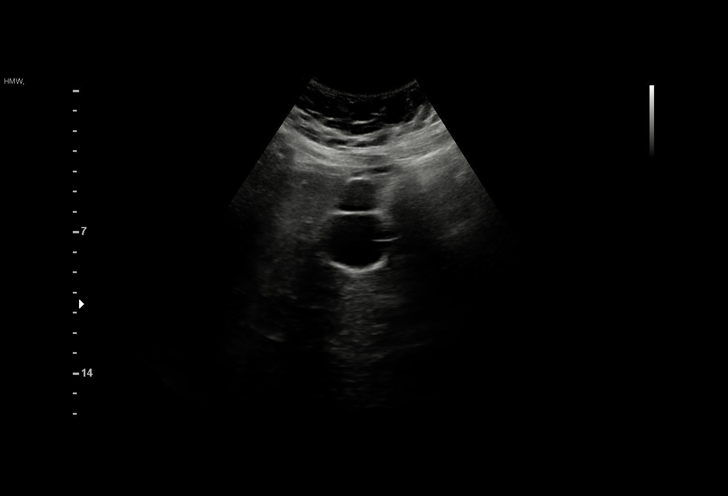
[im 29/46]
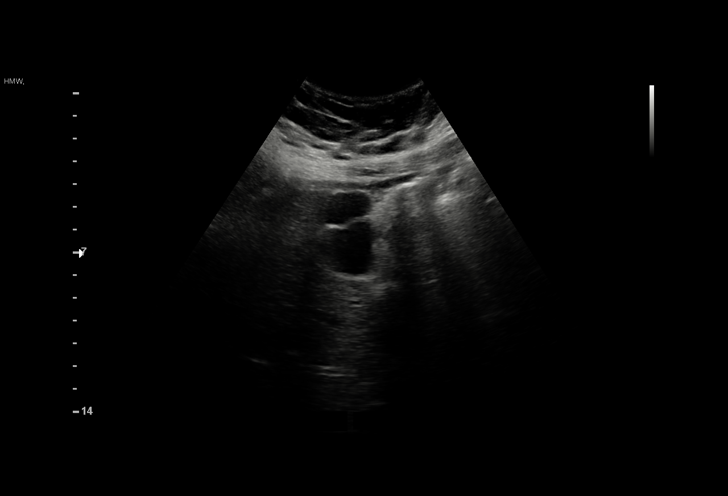
[im 32/46]
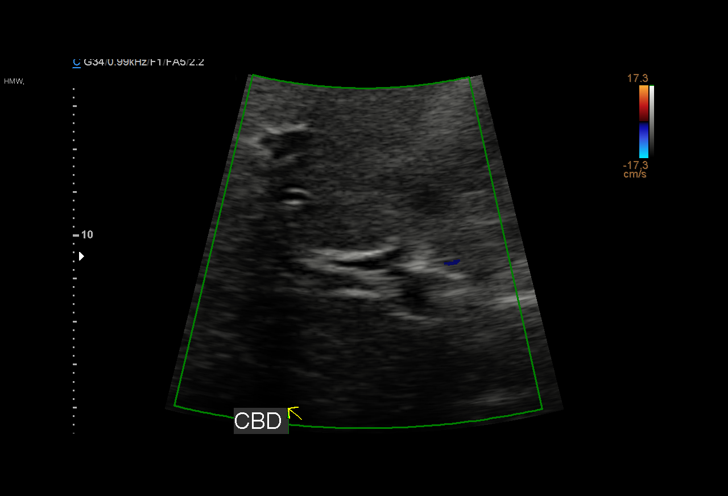
[im 36/46]
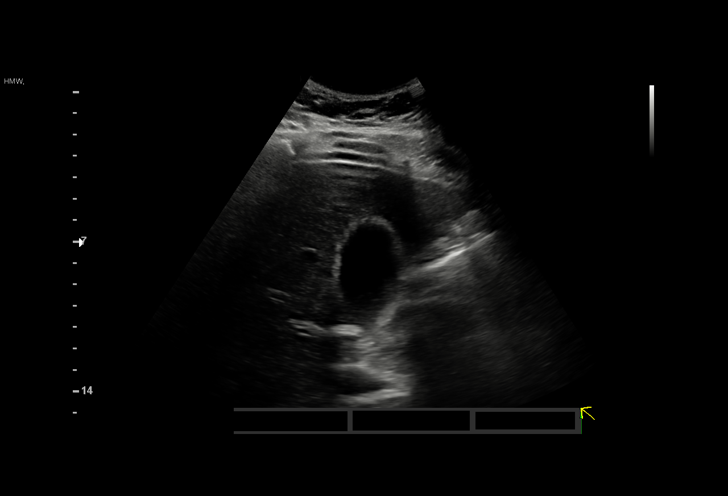
[im 38/46]
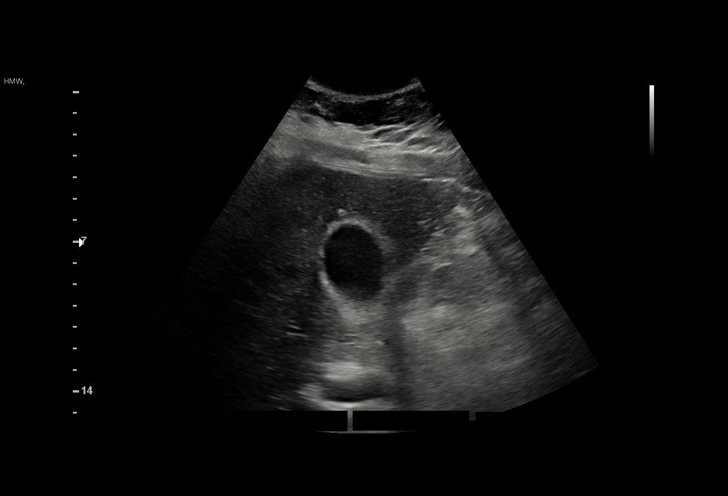
[im 42/46]
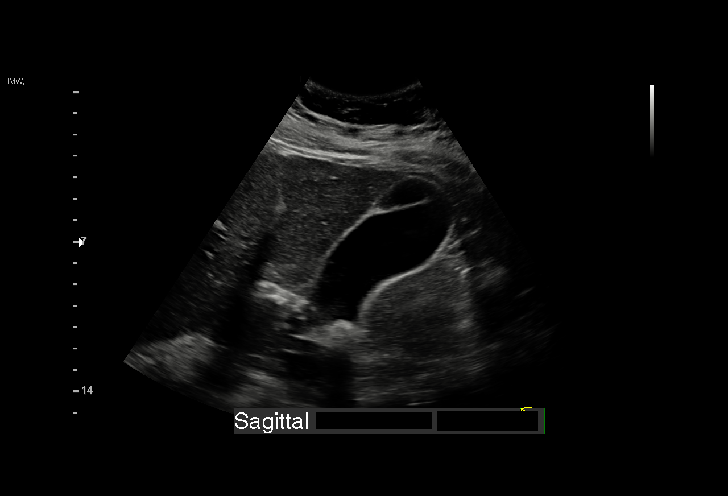
[im 46/46]
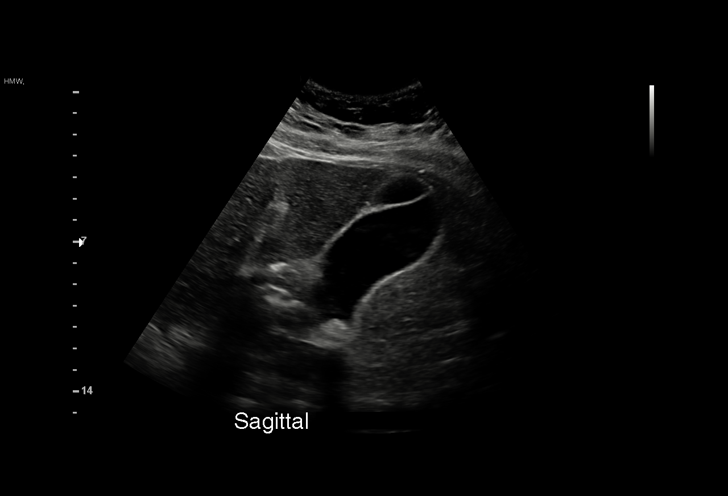

[15 of 25 positions shown; findings below may reference images not displayed]

FINDINGS: Gallbladder:

12 mm gallstone in the gallbladder neck. No gallbladder wall
thickening or pericholecystic fluid. Negative sonographic Murphy's
sign.

Common bile duct:

Diameter: 4 mm

Liver:

No focal lesion identified. Within normal limits in parenchymal
echogenicity. Portal vein is patent on color Doppler imaging with
normal direction of blood flow towards the liver.

Other: None.
IMPRESSION: Cholelithiasis, without associated sonographic findings to suggest
acute cholecystitis.

## 2023-09-02 ENCOUNTER — Encounter: Payer: Self-pay | Admitting: *Deleted
